# Patient Record
Sex: Female | Born: 1935 | Hispanic: Yes | Smoking: Never smoker
Health system: Southern US, Community
[De-identification: ages and names within clinical notes are randomized; demographics above are authoritative.]

## PROBLEM LIST (undated history)

## (undated) DIAGNOSIS — H05039 Periostitis of unspecified orbit: Secondary | ICD-10-CM

## (undated) DIAGNOSIS — I2510 Atherosclerotic heart disease of native coronary artery without angina pectoris: Secondary | ICD-10-CM

## (undated) DIAGNOSIS — E119 Type 2 diabetes mellitus without complications: Secondary | ICD-10-CM

## (undated) DIAGNOSIS — M1990 Unspecified osteoarthritis, unspecified site: Secondary | ICD-10-CM

## (undated) DIAGNOSIS — J45909 Unspecified asthma, uncomplicated: Secondary | ICD-10-CM

## (undated) DIAGNOSIS — K2970 Gastritis, unspecified, without bleeding: Secondary | ICD-10-CM

## (undated) DIAGNOSIS — I10 Essential (primary) hypertension: Secondary | ICD-10-CM

## (undated) HISTORY — PX: CHOLECYSTECTOMY: SHX55

## (undated) HISTORY — PX: PERCUTANEOUS CORONARY STENT INTERVENTION (PCI-S): SHX6016

## (undated) HISTORY — PX: CARDIAC SURGERY: SHX584

---

## 2007-02-09 ENCOUNTER — Ambulatory Visit: Payer: Self-pay | Admitting: Family

## 2008-04-16 ENCOUNTER — Ambulatory Visit: Payer: Self-pay

## 2008-05-29 ENCOUNTER — Ambulatory Visit: Payer: Self-pay

## 2013-07-10 ENCOUNTER — Ambulatory Visit: Payer: Self-pay | Admitting: Nurse Practitioner

## 2015-08-04 ENCOUNTER — Emergency Department
Admission: EM | Admit: 2015-08-04 | Discharge: 2015-08-05 | Disposition: A | Discharge status: Home / Self Care | Payer: Medicare Other | Attending: Emergency Medicine | Admitting: Emergency Medicine

## 2015-08-04 ENCOUNTER — Encounter: Payer: Self-pay | Admitting: Emergency Medicine

## 2015-08-04 DIAGNOSIS — E119 Type 2 diabetes mellitus without complications: Secondary | ICD-10-CM | POA: Diagnosis not present

## 2015-08-04 DIAGNOSIS — I10 Essential (primary) hypertension: Secondary | ICD-10-CM

## 2015-08-04 HISTORY — DX: Essential (primary) hypertension: I10

## 2015-08-04 HISTORY — DX: Gastritis, unspecified, without bleeding: K29.70

## 2015-08-04 HISTORY — DX: Periostitis of unspecified orbit: H05.039

## 2015-08-04 HISTORY — DX: Type 2 diabetes mellitus without complications: E11.9

## 2015-08-04 HISTORY — DX: Unspecified asthma, uncomplicated: J45.909

## 2015-08-04 HISTORY — DX: Unspecified osteoarthritis, unspecified site: M19.90

## 2015-08-04 HISTORY — DX: Atherosclerotic heart disease of native coronary artery without angina pectoris: I25.10

## 2015-08-04 NOTE — ED Notes (Signed)
Today at 0900 patient took blood pressure and the reading was 149/ something.  30 minutes later SBP 170.  Patient also c/o nausea today that worsened throughout the day.

## 2015-08-04 NOTE — ED Notes (Signed)
C/o pain to back of neck and to side of head.  Onset of symptom last night.  Ibuprofen for pain, last taken 1900 this evening.  Sinus congestion noted.  Also c/o productive cough for yellow phlem x 4-5 days.

## 2015-08-05 LAB — CBC
HCT: 34.3 % — ABNORMAL LOW (ref 35.0–47.0)
Hemoglobin: 11.2 g/dL — ABNORMAL LOW (ref 12.0–16.0)
MCH: 26.5 pg (ref 26.0–34.0)
MCHC: 32.5 g/dL (ref 32.0–36.0)
MCV: 81.3 fL (ref 80.0–100.0)
PLATELETS: 275 10*3/uL (ref 150–440)
RBC: 4.22 MIL/uL (ref 3.80–5.20)
RDW: 14 % (ref 11.5–14.5)
WBC: 8.2 10*3/uL (ref 3.6–11.0)

## 2015-08-05 LAB — COMPREHENSIVE METABOLIC PANEL
ALT: 18 U/L (ref 14–54)
ANION GAP: 5 (ref 5–15)
AST: 20 U/L (ref 15–41)
Albumin: 3.6 g/dL (ref 3.5–5.0)
Alkaline Phosphatase: 114 U/L (ref 38–126)
BUN: 20 mg/dL (ref 6–20)
CHLORIDE: 104 mmol/L (ref 101–111)
CO2: 30 mmol/L (ref 22–32)
Calcium: 9.2 mg/dL (ref 8.9–10.3)
Creatinine, Ser: 0.96 mg/dL (ref 0.44–1.00)
GFR calc non Af Amer: 55 mL/min — ABNORMAL LOW (ref >60)
Glucose, Bld: 200 mg/dL — ABNORMAL HIGH (ref 65–99)
Potassium: 4 mmol/L (ref 3.5–5.1)
SODIUM: 139 mmol/L (ref 135–145)
Total Bilirubin: 0.4 mg/dL (ref 0.3–1.2)
Total Protein: 7.1 g/dL (ref 6.5–8.1)

## 2015-08-05 LAB — TROPONIN I: Troponin I: <0.03 ng/mL (ref <0.031)

## 2015-08-05 NOTE — Discharge Instructions (Signed)
Hipertensin (Hypertension) La hipertensin, conocida comnmente como presin arterial alta, se produce cuando la sangre bombea en las arterias con mucha fuerza. Las arterias son los vasos sanguneos que transportan la sangre desde el corazn hacia todas las partes del cuerpo. Una lectura de la presin arterial consiste en un nmero ms alto sobre un nmero ms bajo, por ejemplo, 110/72. El nmero ms alto (presin sistlica) corresponde a la presin interna de las arterias cuando el corazn bombea sangre. El nmero ms bajo (presin diastlica) corresponde a la presin interna de las arterias cuando el corazn se relaja. En condiciones ideales, la presin arterial debe ser inferior a 120/80. La hipertensin fuerza al corazn a trabajar ms para bombear la sangre. Las arterias pueden estrecharse o ponerse rgidas. La hipertensin no tratada o no controlada puede causar infarto de miocardio, ictus, enfermedad renal y otros problemas. FACTORES DE RIESGO Algunos factores de riesgo de hipertensin son controlables, pero otros no lo son.  Entre los factores de riesgo que usted no puede controlar, se incluyen los siguientes:   La raza. El riesgo es mayor para las personas afroamericanas.  La edad. Los riesgos aumentan con la edad.  El sexo. Antes de los 45aos, los hombres corren ms riesgo que las mujeres. Despus de los 65aos, las mujeres corren ms riesgo que los hombres. Entre los factores de riesgo que usted puede controlar, se incluyen los siguientes:  No hacer la cantidad suficiente de actividad fsica o ejercicio.  Tener sobrepeso.  Consumir mucha grasa, azcar, caloras o sal en la dieta.  Beber alcohol en exceso. SIGNOS Y SNTOMAS Por lo general, la hipertensin no causa signos o sntomas. La hipertensin arterial demasiado alta (crisis hipertensiva) puede causar dolor de cabeza, ansiedad, falta de aire y hemorragia nasal. DIAGNSTICO Para detectar si usted tiene hipertensin, el  mdico le medir la presin arterial mientras est sentado, con el brazo levantado a la altura del corazn. Debe medirla al menos dos veces en el mismo brazo. Determinadas condiciones pueden causar una diferencia de presin arterial entre el brazo izquierdo y el derecho. El hecho de tener una sola lectura de la presin arterial ms alta que lo normal no significa que necesita un tratamiento. Si no est claro si tiene hipertensin arterial, es posible que se le pida que regrese otro da para volver a controlarle la presin arterial. O bien se le puede pedir que se controle la presin arterial en su casa durante 1 o ms meses. TRATAMIENTO El tratamiento de la hipertensin arterial incluye hacer cambios en el estilo de vida y, posiblemente, tomar medicamentos. Un estilo de vida saludable puede ayudar a bajar la presin arterial alta. Quiz deba cambiar algunos hbitos. Los cambios en el estilo de vida pueden incluir lo siguiente:  Seguir la dieta DASH. Esta dieta tiene un alto contenido de frutas, verduras y cereales integrales. Incluye poca cantidad de sal, carnes rojas y azcares agregados.  Mantenga el consumo de sodio por debajo de 2300 mg por da.  Realizar al menos entre 30 y 45 minutos de ejercicio aerbico, 4 veces por semana como mnimo.  Perder peso, si es necesario.  No fumar.  Limitar el consumo de bebidas alcohlicas.  Aprender formas de reducir el estrs. El mdico puede recetarle medicamentos si los cambios en el estilo de vida no son suficientes para lograr controlar la presin arterial y si una de las siguientes afirmaciones es verdadera:  Tiene entre 18 y 59 aos y su presin arterial sistlica est por encima de 140.  Tiene   60 aos o ms y su presin arterial sistlica est por encima de 150.  Su presin arterial diastlica est por encima de 90.  Tiene diabetes y su presin arterial sistlica est por encima de 140 o su presin arterial diastlica est por encima de  90.  Tiene una enfermedad renal y su presin arterial est por encima de 140/90.  Tiene una enfermedad cardaca y su presin arterial est por encima de 140/90. La presin arterial deseada puede variar en funcin de las enfermedades, la edad y otros factores personales. INSTRUCCIONES PARA EL CUIDADO EN EL HOGAR  Haga que le midan de nuevo la presin arterial segn las indicaciones del mdico.  Tome los medicamentos solamente como se lo haya indicado el mdico. Siga cuidadosamente las indicaciones. Los medicamentos para la presin arterial deben tomarse segn las indicaciones. Los medicamentos pierden eficacia al omitir las dosis. El hecho de omitir las dosis tambin aumenta el riesgo de otros problemas.  No fume.  Contrlese la presin arterial en su casa segn las indicaciones del mdico. SOLICITE ATENCIN MDICA SI:   Piensa que tiene una reaccin alrgica a los medicamentos.  Tiene mareos o dolores de cabeza con recurrencia.  Tiene hinchazn en los tobillos.  Tiene problemas de visin. SOLICITE ATENCIN MDICA DE INMEDIATO SI:  Siente un dolor de cabeza intenso o confusin.  Siente debilidad inusual, adormecimiento o que se desmayar.  Siente dolor intenso en el pecho o en el abdomen.  Vomita repetidas veces.  Tiene dificultad para respirar. ASEGRESE DE QUE:   Comprende estas instrucciones.  Controlar su afeccin.  Recibir ayuda de inmediato si no mejora o si empeora.   Esta informacin no tiene como fin reemplazar el consejo del mdico. Asegrese de hacerle al mdico cualquier pregunta que tenga.   Document Released: 09/20/2005 Document Revised: 02/04/2015 Elsevier Interactive Patient Education 2016 [employer]  

## 2015-08-05 NOTE — ED Provider Notes (Signed)
[facility] Emergency Department Provider Note  ____________________________________________  Time seen: 12:15 AM  I have reviewed the triage vital signs and the nursing notes.   HISTORY  Chief Complaint No chief complaint on file.    HPI Dana Holloway is a 81 y.o. female present with hypertension noted at 9 PM tonight per patient's family systolic blood pressure was 170 unknown diastolic. Patient has a known history of hypertension for which she takes metoprolol 12.5 mg daily. Patient denies any chest pain no shortness of breath no headache at present.     Past Medical History  Diagnosis Date  . Hypertension   . Coronary artery disease   . Diabetes mellitus without complication (HCC)   . Arthritis   . Orbital osteo-periostitis   . Asthma   . Gastritis     There are no active problems to display for this patient.   Past Surgical History  Procedure Laterality Date  . Cardiac surgery      cardiac cath with stent 2014  . Cholecystectomy      No current outpatient prescriptions on file.  Allergies Bactrim and Sulfa antibiotics  No family history on file.  Social History Social History  Substance Use Topics  . Smoking status: Never Smoker   . Smokeless tobacco: Never Used  . Alcohol Use: No    Review of Systems  Constitutional: Negative for fever. Eyes: Negative for visual changes. ENT: Negative for sore throat. Cardiovascular: Negative for chest pain. Respiratory: Negative for shortness of breath. Gastrointestinal: Negative for abdominal pain, vomiting and diarrhea. Genitourinary: Negative for dysuria. Musculoskeletal: Negative for back pain. Skin: Negative for rash. Neurological: Negative for headaches, focal weakness or numbness.   10-point ROS otherwise negative.  ____________________________________________   PHYSICAL EXAM:  VITAL SIGNS: ED Triage Vitals  Enc Vitals Group     BP 08/04/15 2224 150/66  mmHg     Pulse Rate 08/04/15 2224 88     Resp 08/04/15 2224 16     Temp 08/04/15 2224 98.7 F (37.1 C)     Temp Source 08/04/15 2224 Oral     SpO2 08/04/15 2224 94 %     Weight 08/04/15 2224 170 lb (77.111 kg)     Height 08/04/15 2224 5' (1.524 m)     Head Cir --      Peak Flow --      Pain Score 08/04/15 2228 7     Pain Loc --      Pain Edu? --      Excl. in GC? --      Constitutional: Alert and oriented. Well appearing and in no distress. Eyes: Conjunctivae are normal. PERRL. Normal extraocular movements. ENT   Head: Normocephalic and atraumatic.   Nose: No congestion/rhinnorhea.   Mouth/Throat: Mucous membranes are moist.   Neck: No stridor. Hematological/Lymphatic/Immunilogical: No cervical lymphadenopathy. Cardiovascular: Normal rate, regular rhythm. Normal and symmetric distal pulses are present in all extremities. No murmurs, rubs, or gallops. Respiratory: Normal respiratory effort without tachypnea nor retractions. Breath sounds are clear and equal bilaterally. No wheezes/rales/rhonchi. Gastrointestinal: Soft and nontender. No distention. There is no CVA tenderness. Genitourinary: deferred Musculoskeletal: Nontender with normal range of motion in all extremities. No joint effusions.  No lower extremity tenderness nor edema. Neurologic:  Normal speech and language. No gross focal neurologic deficits are appreciated. Speech is normal.  Skin:  Skin is warm, dry and intact. No rash noted. Psychiatric: Mood and affect are normal. Speech and behavior are normal.  Patient exhibits appropriate insight and judgment.  ____________________________________________    LABS (pertinent positives/negatives)  Labs Reviewed  CBC - Abnormal; Notable for the following:    Hemoglobin 11.2 (*)    HCT 34.3 (*)    All other components within normal limits  COMPREHENSIVE METABOLIC PANEL - Abnormal; Notable for the following:    Glucose, Bld 200 (*)    GFR calc non Af Amer 55  (*)    All other components within normal limits  TROPONIN I     ____________________________________________   EKG  ED ECG REPORT I, BROWN, Buxton N, the attending physician, personally viewed and interpreted this ECG.   Date: 08/05/2015  EKG Time: 10:45 PM  Rate: 81  Rhythm: Normal sinus rhythm  Axis: None  Intervals: Normal  ST&T Change: None      INITIAL IMPRESSION / ASSESSMENT AND PLAN / ED COURSE  Pertinent labs & imaging results that were available during my care of the patient were reviewed by me and considered in my medical decision making (see chart for details).  Patient's repeat blood pressure emergency department 142/82 heart rate of 82 as such patient advised to take 1 metoprolol 25 mg tablet. Patient and her daughter advised to follow-up with primary care provider for appropriate blood pressure management.  ____________________________________________   FINAL CLINICAL IMPRESSION(S) / ED DIAGNOSES  Final diagnoses:  None      Darci Currentandolph N Brown, MD 08/05/15 [phone]

## 2015-08-23 ENCOUNTER — Emergency Department: Payer: Medicare Other

## 2015-08-23 ENCOUNTER — Inpatient Hospital Stay
Admission: EM | Admit: 2015-08-23 | Discharge: 2015-08-25 | DRG: 189 | Disposition: A | Discharge status: Home / Self Care | Payer: Medicare Other | Attending: Specialist | Admitting: Specialist

## 2015-08-23 ENCOUNTER — Encounter: Payer: Self-pay | Admitting: Emergency Medicine

## 2015-08-23 DIAGNOSIS — E119 Type 2 diabetes mellitus without complications: Secondary | ICD-10-CM

## 2015-08-23 DIAGNOSIS — E1140 Type 2 diabetes mellitus with diabetic neuropathy, unspecified: Secondary | ICD-10-CM | POA: Diagnosis present

## 2015-08-23 DIAGNOSIS — Z7902 Long term (current) use of antithrombotics/antiplatelets: Secondary | ICD-10-CM

## 2015-08-23 DIAGNOSIS — Z833 Family history of diabetes mellitus: Secondary | ICD-10-CM

## 2015-08-23 DIAGNOSIS — Z955 Presence of coronary angioplasty implant and graft: Secondary | ICD-10-CM | POA: Diagnosis not present

## 2015-08-23 DIAGNOSIS — Z794 Long term (current) use of insulin: Secondary | ICD-10-CM

## 2015-08-23 DIAGNOSIS — J45909 Unspecified asthma, uncomplicated: Secondary | ICD-10-CM | POA: Diagnosis present

## 2015-08-23 DIAGNOSIS — F329 Major depressive disorder, single episode, unspecified: Secondary | ICD-10-CM | POA: Diagnosis present

## 2015-08-23 DIAGNOSIS — I119 Hypertensive heart disease without heart failure: Secondary | ICD-10-CM | POA: Diagnosis present

## 2015-08-23 DIAGNOSIS — Z7982 Long term (current) use of aspirin: Secondary | ICD-10-CM

## 2015-08-23 DIAGNOSIS — J849 Interstitial pulmonary disease, unspecified: Secondary | ICD-10-CM | POA: Diagnosis present

## 2015-08-23 DIAGNOSIS — G4730 Sleep apnea, unspecified: Secondary | ICD-10-CM | POA: Diagnosis present

## 2015-08-23 DIAGNOSIS — J9601 Acute respiratory failure with hypoxia: Secondary | ICD-10-CM | POA: Diagnosis present

## 2015-08-23 DIAGNOSIS — R0902 Hypoxemia: Secondary | ICD-10-CM | POA: Diagnosis present

## 2015-08-23 DIAGNOSIS — E871 Hypo-osmolality and hyponatremia: Secondary | ICD-10-CM | POA: Diagnosis present

## 2015-08-23 DIAGNOSIS — R1084 Generalized abdominal pain: Secondary | ICD-10-CM | POA: Diagnosis present

## 2015-08-23 DIAGNOSIS — I2510 Atherosclerotic heart disease of native coronary artery without angina pectoris: Secondary | ICD-10-CM | POA: Diagnosis present

## 2015-08-23 DIAGNOSIS — Z882 Allergy status to sulfonamides status: Secondary | ICD-10-CM

## 2015-08-23 DIAGNOSIS — M1990 Unspecified osteoarthritis, unspecified site: Secondary | ICD-10-CM | POA: Diagnosis present

## 2015-08-23 DIAGNOSIS — J449 Chronic obstructive pulmonary disease, unspecified: Secondary | ICD-10-CM | POA: Diagnosis present

## 2015-08-23 DIAGNOSIS — K219 Gastro-esophageal reflux disease without esophagitis: Secondary | ICD-10-CM | POA: Diagnosis present

## 2015-08-23 DIAGNOSIS — E785 Hyperlipidemia, unspecified: Secondary | ICD-10-CM | POA: Diagnosis present

## 2015-08-23 DIAGNOSIS — Z9049 Acquired absence of other specified parts of digestive tract: Secondary | ICD-10-CM

## 2015-08-23 DIAGNOSIS — B349 Viral infection, unspecified: Secondary | ICD-10-CM | POA: Diagnosis present

## 2015-08-23 DIAGNOSIS — R110 Nausea: Secondary | ICD-10-CM

## 2015-08-23 LAB — COMPREHENSIVE METABOLIC PANEL
ALT: 20 U/L (ref 14–54)
AST: 26 U/L (ref 15–41)
Albumin: 4.1 g/dL (ref 3.5–5.0)
Alkaline Phosphatase: 100 U/L (ref 38–126)
Anion gap: 7 (ref 5–15)
BUN: 18 mg/dL (ref 6–20)
CHLORIDE: 96 mmol/L — AB (ref 101–111)
CO2: 29 mmol/L (ref 22–32)
Calcium: 8.8 mg/dL — ABNORMAL LOW (ref 8.9–10.3)
Creatinine, Ser: 0.62 mg/dL (ref 0.44–1.00)
Glucose, Bld: 207 mg/dL — ABNORMAL HIGH (ref 65–99)
POTASSIUM: 3.5 mmol/L (ref 3.5–5.1)
Sodium: 132 mmol/L — ABNORMAL LOW (ref 135–145)
Total Bilirubin: 0.7 mg/dL (ref 0.3–1.2)
Total Protein: 7.5 g/dL (ref 6.5–8.1)

## 2015-08-23 LAB — CBC
HEMATOCRIT: 37.4 % (ref 35.0–47.0)
Hemoglobin: 12.3 g/dL (ref 12.0–16.0)
MCH: 26.8 pg (ref 26.0–34.0)
MCHC: 33 g/dL (ref 32.0–36.0)
MCV: 81.2 fL (ref 80.0–100.0)
PLATELETS: 265 10*3/uL (ref 150–440)
RBC: 4.6 MIL/uL (ref 3.80–5.20)
RDW: 13.5 % (ref 11.5–14.5)
WBC: 12.1 10*3/uL — ABNORMAL HIGH (ref 3.6–11.0)

## 2015-08-23 LAB — TROPONIN I

## 2015-08-23 LAB — GLUCOSE, CAPILLARY: Glucose-Capillary: 203 mg/dL — ABNORMAL HIGH (ref 65–99)

## 2015-08-23 LAB — LIPASE, BLOOD: LIPASE: 22 U/L (ref 11–51)

## 2015-08-23 MED ORDER — IOHEXOL 240 MG/ML SOLN
25.0000 mL | Freq: Once | INTRAMUSCULAR | Status: AC | PRN
  Administered 2015-08-23: 25 mL via ORAL

## 2015-08-23 MED ORDER — ONDANSETRON HCL 4 MG/2ML IJ SOLN
INTRAMUSCULAR | Status: AC
  Filled 2015-08-23: qty 2

## 2015-08-23 MED ORDER — IOHEXOL 300 MG/ML  SOLN
50.0000 mL | Freq: Once | INTRAMUSCULAR | Status: AC | PRN
  Administered 2015-08-23: 100 mL via INTRAVENOUS

## 2015-08-23 MED ORDER — MORPHINE SULFATE (PF) 4 MG/ML IV SOLN: 4.0000 mg | Freq: Once | INTRAVENOUS | Status: DC

## 2015-08-23 MED ORDER — ONDANSETRON HCL 4 MG/2ML IJ SOLN
4.0000 mg | Freq: Once | INTRAMUSCULAR | Status: AC
  Administered 2015-08-23: 4 mg via INTRAVENOUS

## 2015-08-23 MED ORDER — SODIUM CHLORIDE 0.9 % IV BOLUS (SEPSIS)
1000.0000 mL | Freq: Once | INTRAVENOUS | Status: AC
  Administered 2015-08-23: 1000 mL via INTRAVENOUS

## 2015-08-23 NOTE — ED Notes (Signed)
CT tech notified pt finished drinking contrast.  

## 2015-08-23 NOTE — ED Notes (Signed)
Per pt she had on episode of emesis about a week ago, reports today she ate some lentils int he afternoon and 2 hours after she ate she developed some mid abdominal pain tender to touch, actively vomiting. Pt reports some shortness of breath has history of lung disease unsure of diagnosis. Pt talks in complete sentences no respiratory distress noted.

## 2015-08-23 NOTE — ED Notes (Signed)
Pt transported to CT via stretcher.  

## 2015-08-23 NOTE — ED Notes (Signed)
Pt tolerating drinking contrast well. Pt reported feeling dizzy, states the last time she felt dizzy sugar was low. CBG result was 203. Dr. Lenard LancePaduchowski made aware.

## 2015-08-23 NOTE — ED Provider Notes (Addendum)
[facility] Emergency Department Provider Note  Time seen: 10:35 PM  I have reviewed the triage vital signs and the nursing notes.   HISTORY  Chief Complaint Cough; Abdominal Pain; and Emesis  Hospital interpreter used during this evaluation  HPI Dana Holloway is a 81 y.o. female with a past medical history of hypertension, diabetes, arthritis, asthma, gastritis, who presents the emergency department with upper abdominal pain and nausea and vomiting. According to the patient she ate lunch around 1 PM, shortly after she began with upper abdominal pain and nausea and vomiting. Patient states she has been vomiting ever since. A small amount of diarrhea today as well. Denies any shortness of breath however the patient has an 88% room air oxygen saturation in the emergency department. Denies fever. States mild cough recently but this is largely unchanged from baseline. States abdominal pain is located in the upper abdomen, moderate in severity associated with nausea and vomiting.     Past Medical History  Diagnosis Date  . Hypertension   . Coronary artery disease   . Diabetes mellitus without complication (HCC)   . Arthritis   . Orbital osteo-periostitis   . Asthma   . Gastritis     There are no active problems to display for this patient.   Past Surgical History  Procedure Laterality Date  . Cardiac surgery      cardiac cath with stent 2014  . Cholecystectomy      No current outpatient prescriptions on file.  Allergies Bactrim and Sulfa antibiotics  History reviewed. No pertinent family history.  Social History Social History  Substance Use Topics  . Smoking status: Never Smoker   . Smokeless tobacco: Never Used  . Alcohol Use: No    Review of Systems Constitutional: Negative for fever Cardiovascular: Negative for chest pain. Respiratory: Negative for shortness of breath. Gastrointestinal: Upper abdominal pain, positive for  nausea and vomiting. Mild diarrhea. Genitourinary: Negative for dysuria. Musculoskeletal: Negative for back pain. Neurological: Negative for headache 10-point ROS otherwise negative.  ____________________________________________   PHYSICAL EXAM:  VITAL SIGNS: ED Triage Vitals  Enc Vitals Group     BP 08/23/15 2156 145/62 mmHg     Pulse Rate 08/23/15 2156 79     Resp 08/23/15 2156 18     Temp 08/23/15 2156 97.6 F (36.4 C)     Temp Source 08/23/15 2156 Oral     SpO2 08/23/15 2156 92 %     Weight 08/23/15 2156 173 lb (78.472 kg)     Height 08/23/15 2156  (1.549 m)     Head Cir --      Peak Flow --      Pain Score 08/23/15 2210 3     Pain Loc --      Pain Edu? --      Excl. in GC? --    Constitutional: Alert and oriented. Well appearing and in no distress. Eyes: Normal exam ENT   Head: Normocephalic and atraumatic   Mouth/Throat: Mucous membranes are moist. Cardiovascular: Normal rate, regular rhythm. No murmur Respiratory: Normal respiratory effort without tachypnea nor retractions. Breath sounds are clear and equal bilaterally. No wheezes/rales/rhonchi. Gastrointestinal: Soft, mild to moderate epigastric tenderness to palpation. No rebound or guarding. No distention. Musculoskeletal: Nontender with normal range of motion in all extremities. Neurologic:  Normal speech and language. No gross focal neurologic deficits Skin:  Skin is warm, dry and intact.  Psychiatric: Mood and affect are normal. Speech and behavior  are normal.  ____________________________________________    RADIOLOGY  Chest x-ray shows no acute abnormality. Likely chronic scarring.  ____________________________________________    INITIAL IMPRESSION / ASSESSMENT AND PLAN / ED COURSE  Pertinent labs & imaging results that were available during my care of the patient were reviewed by me and considered in my medical decision making (see chart for details).  Patient presents for abdominal  pain, nausea and vomiting. Patient has a room air oxygen saturation of 90 % currently. Denies the sensation of dyspnea. Mild cough but states this is largely her baseline. We will check labs, chest x-ray, CT abdomen and pelvis, and closely monitor in the emergency department.  Patient denies any dyspnea. She states she was told by her doctor that her lungs just don't work quite right, in reviewing the patient's past oxygen saturations in the emergency department it appears that she typically stays 94-95% on room air.workup so far is largely within normal limits besides a mild leukocytosis of 12. Currently awaiting CT scan results. Patient care signed out to Dr. Zenda AlpersWebster, CT pending.  EKG reviewed and interpreted by myself shows normal sinus rhythm at 77 bpm, narrow QRS, left axis deviation, normal intervals, nonspecific ST changes. No ST elevations.  ____________________________________________   FINAL CLINICAL IMPRESSION(S) / ED DIAGNOSES  Abdominal pain Nausea, vomiting, diarrhea   Minna AntisKevin Shigeko Manard, MD 08/23/15 2345  Minna AntisKevin Raydin Bielinski, MD 08/24/15 0005

## 2015-08-23 NOTE — ED Notes (Signed)
Patient transported to CT via stretcher.

## 2015-08-23 NOTE — ED Notes (Signed)
Pt returned from CT via stretcher.

## 2015-08-23 NOTE — ED Notes (Signed)
Pt arrived to the ED accompanied by her daughter for complaints of abdominal pain, nausea and cough. Pt states that she is unable to hold any food down and that it feels like she has a ball in her stomach. Pt is actively coughing during triage and her O2 sats are 91%. Pt is AOx4 in moderate distress.

## 2015-08-24 ENCOUNTER — Encounter: Payer: Self-pay | Admitting: Internal Medicine

## 2015-08-24 ENCOUNTER — Inpatient Hospital Stay: Payer: Medicare Other

## 2015-08-24 DIAGNOSIS — R1084 Generalized abdominal pain: Secondary | ICD-10-CM | POA: Diagnosis present

## 2015-08-24 DIAGNOSIS — J849 Interstitial pulmonary disease, unspecified: Secondary | ICD-10-CM | POA: Diagnosis present

## 2015-08-24 DIAGNOSIS — B349 Viral infection, unspecified: Secondary | ICD-10-CM | POA: Diagnosis present

## 2015-08-24 DIAGNOSIS — G4730 Sleep apnea, unspecified: Secondary | ICD-10-CM | POA: Diagnosis present

## 2015-08-24 DIAGNOSIS — F329 Major depressive disorder, single episode, unspecified: Secondary | ICD-10-CM | POA: Diagnosis present

## 2015-08-24 DIAGNOSIS — E785 Hyperlipidemia, unspecified: Secondary | ICD-10-CM | POA: Diagnosis present

## 2015-08-24 DIAGNOSIS — I119 Hypertensive heart disease without heart failure: Secondary | ICD-10-CM | POA: Diagnosis present

## 2015-08-24 DIAGNOSIS — E871 Hypo-osmolality and hyponatremia: Secondary | ICD-10-CM | POA: Diagnosis present

## 2015-08-24 DIAGNOSIS — M1990 Unspecified osteoarthritis, unspecified site: Secondary | ICD-10-CM | POA: Diagnosis present

## 2015-08-24 DIAGNOSIS — Z833 Family history of diabetes mellitus: Secondary | ICD-10-CM | POA: Diagnosis not present

## 2015-08-24 DIAGNOSIS — Z9049 Acquired absence of other specified parts of digestive tract: Secondary | ICD-10-CM | POA: Diagnosis not present

## 2015-08-24 DIAGNOSIS — R110 Nausea: Secondary | ICD-10-CM | POA: Diagnosis present

## 2015-08-24 DIAGNOSIS — Z7902 Long term (current) use of antithrombotics/antiplatelets: Secondary | ICD-10-CM | POA: Diagnosis not present

## 2015-08-24 DIAGNOSIS — K219 Gastro-esophageal reflux disease without esophagitis: Secondary | ICD-10-CM | POA: Diagnosis present

## 2015-08-24 DIAGNOSIS — E1140 Type 2 diabetes mellitus with diabetic neuropathy, unspecified: Secondary | ICD-10-CM | POA: Diagnosis present

## 2015-08-24 DIAGNOSIS — J449 Chronic obstructive pulmonary disease, unspecified: Secondary | ICD-10-CM | POA: Diagnosis present

## 2015-08-24 DIAGNOSIS — Z794 Long term (current) use of insulin: Secondary | ICD-10-CM | POA: Diagnosis not present

## 2015-08-24 DIAGNOSIS — J45909 Unspecified asthma, uncomplicated: Secondary | ICD-10-CM | POA: Diagnosis present

## 2015-08-24 DIAGNOSIS — J9601 Acute respiratory failure with hypoxia: Secondary | ICD-10-CM | POA: Diagnosis present

## 2015-08-24 DIAGNOSIS — Z955 Presence of coronary angioplasty implant and graft: Secondary | ICD-10-CM | POA: Diagnosis not present

## 2015-08-24 DIAGNOSIS — R0902 Hypoxemia: Secondary | ICD-10-CM | POA: Diagnosis present

## 2015-08-24 DIAGNOSIS — Z882 Allergy status to sulfonamides status: Secondary | ICD-10-CM | POA: Diagnosis not present

## 2015-08-24 DIAGNOSIS — I2510 Atherosclerotic heart disease of native coronary artery without angina pectoris: Secondary | ICD-10-CM | POA: Diagnosis present

## 2015-08-24 DIAGNOSIS — Z7982 Long term (current) use of aspirin: Secondary | ICD-10-CM | POA: Diagnosis not present

## 2015-08-24 LAB — GLUCOSE, CAPILLARY
Glucose-Capillary: 166 mg/dL — ABNORMAL HIGH (ref 65–99)
Glucose-Capillary: 166 mg/dL — ABNORMAL HIGH (ref 65–99)
Glucose-Capillary: 174 mg/dL — ABNORMAL HIGH (ref 65–99)
Glucose-Capillary: 177 mg/dL — ABNORMAL HIGH (ref 65–99)
Glucose-Capillary: 237 mg/dL — ABNORMAL HIGH (ref 65–99)

## 2015-08-24 LAB — URINALYSIS COMPLETE WITH MICROSCOPIC (ARMC ONLY)
BILIRUBIN URINE: NEGATIVE
Bacteria, UA: NONE SEEN
Glucose, UA: 150 mg/dL — AB
HGB URINE DIPSTICK: NEGATIVE
KETONES UR: NEGATIVE mg/dL
LEUKOCYTES UA: NEGATIVE
Nitrite: NEGATIVE
PH: 8 (ref 5.0–8.0)
Protein, ur: NEGATIVE mg/dL
SPECIFIC GRAVITY, URINE: 1.028 (ref 1.005–1.030)
Squamous Epithelial / LPF: NONE SEEN

## 2015-08-24 LAB — BLOOD GAS, ARTERIAL
ACID-BASE EXCESS: 5.7 mmol/L — AB (ref 0.0–3.0)
BICARBONATE: 31.6 meq/L — AB (ref 21.0–28.0)
FIO2: 0.28
O2 Saturation: 93.8 %
PATIENT TEMPERATURE: 37
PH ART: 7.4 (ref 7.350–7.450)
pCO2 arterial: 51 mmHg — ABNORMAL HIGH (ref 32.0–48.0)
pO2, Arterial: 70 mmHg — ABNORMAL LOW (ref 83.0–108.0)

## 2015-08-24 LAB — HEMOGLOBIN A1C: Hgb A1c MFr Bld: 6.8 % — ABNORMAL HIGH (ref 4.0–6.0)

## 2015-08-24 MED ORDER — NITROGLYCERIN 0.4 MG SL SUBL
0.4000 mg | SUBLINGUAL_TABLET | SUBLINGUAL | Status: DC | PRN
Start: 2015-08-24 — End: 2015-08-25

## 2015-08-24 MED ORDER — DOCUSATE SODIUM 100 MG PO CAPS
100.0000 mg | ORAL_CAPSULE | Freq: Two times a day (BID) | ORAL | Status: DC
  Administered 2015-08-24 – 2015-08-25 (×3): 100 mg via ORAL
  Filled 2015-08-24 (×3): qty 1

## 2015-08-24 MED ORDER — BECLOMETHASONE DIPROPIONATE 80 MCG/ACT IN AERS: 2.0000 | INHALATION_SPRAY | Freq: Two times a day (BID) | RESPIRATORY_TRACT | Status: DC

## 2015-08-24 MED ORDER — ONDANSETRON HCL 4 MG/2ML IJ SOLN: 4.0000 mg | Freq: Four times a day (QID) | INTRAMUSCULAR | Status: DC | PRN

## 2015-08-24 MED ORDER — METOPROLOL SUCCINATE ER 50 MG PO TB24
50.0000 mg | ORAL_TABLET | Freq: Every day | ORAL | Status: DC
  Administered 2015-08-24 – 2015-08-25 (×2): 50 mg via ORAL
  Filled 2015-08-24 (×2): qty 1

## 2015-08-24 MED ORDER — PROMETHAZINE HCL 25 MG/ML IJ SOLN
12.5000 mg | Freq: Once | INTRAMUSCULAR | Status: AC
  Administered 2015-08-24: 12.5 mg via INTRAVENOUS
  Filled 2015-08-24: qty 1

## 2015-08-24 MED ORDER — ASPIRIN EC 81 MG PO TBEC
81.0000 mg | DELAYED_RELEASE_TABLET | Freq: Every day | ORAL | Status: DC
  Administered 2015-08-24 – 2015-08-25 (×2): 81 mg via ORAL
  Filled 2015-08-24 (×2): qty 1

## 2015-08-24 MED ORDER — ONDANSETRON HCL 4 MG PO TABS: 4.0000 mg | ORAL_TABLET | Freq: Four times a day (QID) | ORAL | Status: DC | PRN

## 2015-08-24 MED ORDER — ATORVASTATIN CALCIUM 10 MG PO TABS
10.0000 mg | ORAL_TABLET | Freq: Every day | ORAL | Status: DC
  Administered 2015-08-24: 22:00:00 10 mg via ORAL
  Filled 2015-08-24: qty 1

## 2015-08-24 MED ORDER — METHYLPREDNISOLONE SODIUM SUCC 125 MG IJ SOLR
125.0000 mg | Freq: Once | INTRAMUSCULAR | Status: AC
  Administered 2015-08-24: 125 mg via INTRAVENOUS
  Filled 2015-08-24: qty 2

## 2015-08-24 MED ORDER — ADULT MULTIVITAMIN W/MINERALS CH
1.0000 | ORAL_TABLET | Freq: Every day | ORAL | Status: DC
  Administered 2015-08-24: 1 via ORAL
  Filled 2015-08-24: qty 1

## 2015-08-24 MED ORDER — INSULIN ASPART 100 UNIT/ML ~~LOC~~ SOLN
0.0000 [IU] | Freq: Three times a day (TID) | SUBCUTANEOUS | Status: DC
  Administered 2015-08-24 (×2): 3 [IU] via SUBCUTANEOUS
  Administered 2015-08-24: 5 [IU] via SUBCUTANEOUS
  Administered 2015-08-25: 2 [IU] via SUBCUTANEOUS
  Filled 2015-08-24: qty 2
  Filled 2015-08-24 (×2): qty 3
  Filled 2015-08-24: qty 5

## 2015-08-24 MED ORDER — QUETIAPINE FUMARATE 25 MG PO TABS
25.0000 mg | ORAL_TABLET | Freq: Every day | ORAL | Status: DC
  Administered 2015-08-24: 25 mg via ORAL
  Filled 2015-08-24: qty 1

## 2015-08-24 MED ORDER — HEPARIN SODIUM (PORCINE) 5000 UNIT/ML IJ SOLN
5000.0000 [IU] | Freq: Three times a day (TID) | INTRAMUSCULAR | Status: DC
  Administered 2015-08-24 – 2015-08-25 (×4): 5000 [IU] via SUBCUTANEOUS
  Filled 2015-08-24 (×4): qty 1

## 2015-08-24 MED ORDER — MICONAZOLE NITRATE 2 % EX CREA
1.0000 "application " | TOPICAL_CREAM | Freq: Two times a day (BID) | CUTANEOUS | Status: DC
  Administered 2015-08-24 (×2): 1 via TOPICAL
  Filled 2015-08-24 (×2): qty 14

## 2015-08-24 MED ORDER — IPRATROPIUM-ALBUTEROL 0.5-2.5 (3) MG/3ML IN SOLN
3.0000 mL | Freq: Once | RESPIRATORY_TRACT | Status: AC
  Administered 2015-08-24: 3 mL via RESPIRATORY_TRACT
  Filled 2015-08-24: qty 3

## 2015-08-24 MED ORDER — LOSARTAN POTASSIUM 25 MG PO TABS
12.5000 mg | ORAL_TABLET | Freq: Every day | ORAL | Status: DC
  Administered 2015-08-24 – 2015-08-25 (×2): 12.5 mg via ORAL
  Filled 2015-08-24 (×2): qty 0.5

## 2015-08-24 MED ORDER — ACETAMINOPHEN 325 MG PO TABS
650.0000 mg | ORAL_TABLET | Freq: Four times a day (QID) | ORAL | Status: DC | PRN
  Administered 2015-08-24: 650 mg via ORAL
  Filled 2015-08-24: qty 2

## 2015-08-24 MED ORDER — SODIUM CHLORIDE 0.9 % IV SOLN
INTRAVENOUS | Status: DC
  Administered 2015-08-24: 100 mL via INTRAVENOUS
  Administered 2015-08-24: 21:00:00 via INTRAVENOUS

## 2015-08-24 MED ORDER — INSULIN GLARGINE 100 UNIT/ML ~~LOC~~ SOLN
28.0000 [IU] | Freq: Every day | SUBCUTANEOUS | Status: DC
  Administered 2015-08-24: 28 [IU] via SUBCUTANEOUS
  Filled 2015-08-24 (×3): qty 0.28

## 2015-08-24 MED ORDER — PREGABALIN 50 MG PO CAPS
50.0000 mg | ORAL_CAPSULE | Freq: Two times a day (BID) | ORAL | Status: DC
  Administered 2015-08-24 – 2015-08-25 (×2): 50 mg via ORAL
  Filled 2015-08-24 (×2): qty 1

## 2015-08-24 MED ORDER — MOMETASONE FURO-FORMOTEROL FUM 100-5 MCG/ACT IN AERO
2.0000 | INHALATION_SPRAY | Freq: Two times a day (BID) | RESPIRATORY_TRACT | Status: DC
  Administered 2015-08-24 – 2015-08-25 (×3): 2 via RESPIRATORY_TRACT
  Filled 2015-08-24 (×2): qty 8.8

## 2015-08-24 MED ORDER — CLOPIDOGREL BISULFATE 75 MG PO TABS
75.0000 mg | ORAL_TABLET | Freq: Every day | ORAL | Status: DC
  Administered 2015-08-24 – 2015-08-25 (×2): 75 mg via ORAL
  Filled 2015-08-24 (×2): qty 1

## 2015-08-24 MED ORDER — PANTOPRAZOLE SODIUM 40 MG PO TBEC
40.0000 mg | DELAYED_RELEASE_TABLET | Freq: Every day | ORAL | Status: DC
  Administered 2015-08-24 – 2015-08-25 (×2): 40 mg via ORAL
  Filled 2015-08-24 (×2): qty 1

## 2015-08-24 MED ORDER — VITAMIN D 1000 UNITS PO TABS
1000.0000 [IU] | ORAL_TABLET | Freq: Every day | ORAL | Status: DC
  Administered 2015-08-24 – 2015-08-25 (×2): 1000 [IU] via ORAL
  Filled 2015-08-24 (×2): qty 1

## 2015-08-24 MED ORDER — METHYLPREDNISOLONE SODIUM SUCC 125 MG IJ SOLR: 60.0000 mg | Freq: Once | INTRAMUSCULAR | Status: DC

## 2015-08-24 MED ORDER — ACETAMINOPHEN 650 MG RE SUPP
650.0000 mg | Freq: Four times a day (QID) | RECTAL | Status: DC | PRN
Start: 2015-08-24 — End: 2015-08-25

## 2015-08-24 MED ORDER — CITALOPRAM HYDROBROMIDE 10 MG PO TABS
10.0000 mg | ORAL_TABLET | Freq: Every day | ORAL | Status: DC
  Administered 2015-08-24 – 2015-08-25 (×2): 10 mg via ORAL
  Filled 2015-08-24 (×2): qty 1

## 2015-08-24 MED ORDER — ALBUTEROL SULFATE (2.5 MG/3ML) 0.083% IN NEBU: 3.0000 mL | INHALATION_SOLUTION | Freq: Four times a day (QID) | RESPIRATORY_TRACT | Status: DC | PRN

## 2015-08-24 NOTE — ED Notes (Signed)
Pt taken off oxygen per MD order, pt O2 sat dropped to 86-87% RA. Dr. Zenda AlpersWebster notified, pt placed back on 2 L nasal cannula. O2 sat is now 98%. Pt reports she was told she had a "bad lung" but is unsure what diagnosis was. Pt has inhalers at home which she takes when feeling SHOB. Pt denies being on oxygen at home.

## 2015-08-24 NOTE — Progress Notes (Signed)
[facility] [place] [facility]   PATIENT NAME: Dana Holloway    MR#:  [id number]  DATE OF BIRTH:  Nov 26, 1933  SUBJECTIVE:   Patient here due to abdominal pain, nausea vomiting. But also incidentally noted to be hypoxic. Patient denies any pain, nausea vomiting today. Abdominal CT on admission was negative. Daughter at bedside. Patient seen with the help of Spanish interpreter.  REVIEW OF SYSTEMS:    Review of Systems  Constitutional: Negative for fever and chills.  HENT: Negative for congestion and tinnitus.   Eyes: Negative for blurred vision and double vision.  Respiratory: Positive for shortness of breath. Negative for cough and wheezing.   Cardiovascular: Negative for chest pain, orthopnea and PND.  Gastrointestinal: Negative for nausea, vomiting, abdominal pain and diarrhea.  Genitourinary: Negative for dysuria and hematuria.  Neurological: Negative for dizziness, sensory change and focal weakness.  All other systems reviewed and are negative.   Nutrition: Heart healthy Tolerating Diet: yes Tolerating PT: Ambulatory   DRUG ALLERGIES:   Allergies  Allergen Reactions  . Bactrim [Sulfamethoxazole-Trimethoprim] Hives  . Sulfa Antibiotics Hives    VITALS:  Blood pressure 111/45, pulse 85, temperature 98.2 F (36.8 C), temperature source Oral, resp. rate 18, height  (1.549 m), weight 79.334 kg (174 lb 14.4 oz), SpO2 91 %.  PHYSICAL EXAMINATION:   Physical Exam  GENERAL:  81 y.o.-year-old patient lying in the bed with no acute distress.  EYES: Pupils equal, round, reactive to light and accommodation. No scleral icterus. Extraocular muscles intact.  HEENT: Head atraumatic, normocephalic. Oropharynx and nasopharynx clear.  NECK:  Supple, no jugular venous distention. No thyroid enlargement, no tenderness.  LUNGS: Normal breath sounds bilaterally, no wheezing, rales, rhonchi. No use of accessory muscles of respiration.   CARDIOVASCULAR: S1, S2 normal. No murmurs, rubs, or gallops.  ABDOMEN: Soft, nontender, nondistended. Bowel sounds present. No organomegaly or mass.  EXTREMITIES: No cyanosis, clubbing or edema b/l.    NEUROLOGIC: Cranial nerves II through XII are intact. No focal Motor or sensory deficits b/l.   PSYCHIATRIC: The patient is alert and oriented x 3. Good affect.  SKIN: No obvious rash, lesion, or ulcer.    LABORATORY PANEL:   CBC  Recent Labs Lab 08/23/15 2241  WBC 12.1*  HGB 12.3  HCT 37.4  PLT 265   ------------------------------------------------------------------------------------------------------------------  Chemistries   Recent Labs Lab 08/23/15 2241  NA 132*  K 3.5  CL 96*  CO2 29  GLUCOSE 207*  BUN 18  CREATININE 0.62  CALCIUM 8.8*  AST 26  ALT 20  ALKPHOS 100  BILITOT 0.7   ------------------------------------------------------------------------------------------------------------------  Cardiac Enzymes  Recent Labs Lab 08/23/15 2241  TROPONINI <0.03   ------------------------------------------------------------------------------------------------------------------  RADIOLOGY:  Dg Chest 2 View  08/23/2015  CLINICAL DATA:  Upper abdominal pain, nausea and vomiting. History of hypertension, diabetes. EXAM: CHEST  2 VIEW COMPARISON:  Chest radiograph Feb 09, 2007 FINDINGS: The cardiac silhouette is moderately enlarged, similar to slightly increased. Diffuse interstitial prominence appears chronic, without pleural effusion or focal consolidations. No pneumothorax. Soft tissue planes and included osseous structures are nonsuspicious, old approximate T9 compression fracture. IMPRESSION: Similar to slightly progressed cardiomegaly. Diffuse interstitial prominence, likely chronic though, can also be seen with atypical infection. Electronically Signed   By: Awilda Metro M.D.   On: 08/23/2015 23:22   Ct Abdomen Pelvis W Contrast  08/24/2015  CLINICAL  DATA:  Upper abdominal pain with EXAM: CT ABDOMEN AND PELVIS WITH CONTRAST TECHNIQUE:  Multidetector CT imaging of the abdomen and pelvis was performed using the standard protocol following bolus administration of intravenous contrast. CONTRAST:  100mL OMNIPAQUE IOHEXOL 300 MG/ML  SOLN COMPARISON:  None. FINDINGS: Lower chest: Increased AP diameter of the thorax. Heart at the upper limits normal in size. Linear atelectasis in the right lower lobe. Liver: No focal lesion. Hepatobiliary: Postcholecystectomy.  No biliary dilatation. Pancreas: No ductal dilatation or inflammation. Mild atrophy of the tail. Spleen: Normal. Adrenal glands: No nodule. Kidneys: Symmetric renal enhancement and excretion. No hydronephrosis. No perinephric stranding or focal renal abnormality. Stomach/Bowel: Stomach physiologically distended. There are no dilated or thickened small bowel loops. Moderate volume of stool throughout the colon without colonic wall thickening. The appendix is normal. Vascular/Lymphatic: No retroperitoneal adenopathy. Abdominal aorta is normal in caliber. Reproductive: Uterus appears surgically absent. No adnexal mass. Ovaries not defined. Bladder: Physiologically distended, no wall thickening. Other: Fat containing umbilical hernia. Subcutaneous edema in the anterior abdominal wall in a pattern consistent with injections. No free air, free fluid, or intra-abdominal fluid collection. Musculoskeletal: There are no acute or suspicious osseous abnormalities. Degenerative change throughout spine. IMPRESSION: 1. No acute abnormality in the abdomen/pelvis. 2. Postcholecystectomy without biliary dilatation. Small fat containing umbilical hernia. Electronically Signed   By: Rubye OaksMelanie  Ehinger M.D.   On: 08/24/2015 00:24     ASSESSMENT AND PLAN:   81 year old Spanish female with past medical history of diabetes, hypertension, history of coronary artery disease, osteoarthritis, asthma, gastritis who presents to the  hospital due to abdominal pain nausea vomiting and also noted to be hypoxic.  #1 acute respiratory failure with hypoxia-the exact etiology of this is unclear presently. -Patient has no history of tobacco abuse, chest x-ray showed some diffuse interstitial prominence. 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   -No evidence of CHF clinically. I will get a high-resolution CT scan to evaluate her lungs. Await pulmonary evaluation.  #2 abdominal pain nausea and vomiting-etiology unclear but likely secondary to a viral illness which is now resolved. CT abdomen pelvis on admission was essentially normal. -Patient tolerating oral diet well. Continue supportive care for now.  #3 diabetes type 2 without complication-continue Lantus, sliding scale insulin.  #4 hypertension-continue losartan, metoprolol.  #5 depression-continue Seroquel.  #6 diabetic neuropathy-continue Lyrica  #7 GERD-continue Protonix.  #8 history of coronary artery disease-no chest pain. Continue aspirin, Plavix, beta blocker, statin.   All the records are reviewed and case discussed with Care Management/Social Workerr. Management plans discussed with the patient, family and they are in agreement.  CODE STATUS: Full  DVT Prophylaxis: Heparin subcutaneous  TOTAL TIME TAKING CARE OF THIS PATIENT: 35 minutes.   POSSIBLE D/C IN 1-2 DAYS, DEPENDING ON CLINICAL CONDITION.   Houston SirenSAINANI,Merit Gadsby J M.D on 08/24/2015 at 1:25 PM  Between 7am to 6pm - Pager - [phone]  After 6pm go to [email or web address] - password [email or web address]  [facility]  Office  [phone]  CC: Primary care physician; Pcp Not In System

## 2015-08-24 NOTE — Plan of Care (Signed)
Problem: Education: Goal: Knowledge of [facility] Education information/materials will improve Outcome: Progressing Pt lives with daughter. Pt does not speak [place].     Past Medical History   Diagnosis  Date   .  Hypertension     .  Coronary artery disease     .  Diabetes mellitus without complication (HCC)     .  Arthritis     .  Orbital osteo-periostitis     .  Asthma     .  Gastritis            Pt is well controlled by home medications.        Problem: Bowel/Gastric: Goal: Will not experience complications related to bowel motility Outcome: Progressing Pt was admitted at shift change. Pt alert and oriented. No c/o pain nor distress noted. Daughter at bedside. Continue to monitor.

## 2015-08-24 NOTE — H&P (Signed)
Dana Holloway is an 81 y.o. female.   Chief Complaint: Abdominal pain HPI: The patient presents emergency department complaining of abdominal pain. This has been a chronic complaint albeit intermittent. She admits to constipation at times. She also states that she feels short of breath and has been coughing "forever". She had worked as a cook over open fire for 40 years. The patient admits to some sputum production with cough but denies fevers. She admits to nausea but no vomiting. She also states that her blood pressure the tendency to increase at night which causes her difficulty breathing. She also notes palpitations when this occurs. The timing of her symptoms is difficult to pinpoint as the patient is not the best historian. In the emergency department the patient was found to be hypoxic to the mid 80s. She does not wear oxygen at home. Despite supplemental oxygen her peripheral oxygen saturations increased to just the low 90s which prompted emergency department staff to call for admission.  Past Medical History  Diagnosis Date  . Hypertension   . Coronary artery disease   . Diabetes mellitus without complication (HCC)   . Arthritis   . Orbital osteo-periostitis   . Asthma   . Gastritis     Past Surgical History  Procedure Laterality Date  . Cardiac surgery      cardiac cath with stent 2014  . Cholecystectomy      Family History  Problem Relation Age of Onset  . Diabetes Mellitus II Other    Social History:  reports that she has never smoked. She has never used smokeless tobacco. She reports that she does not drink alcohol or use illicit drugs.  Allergies:  Allergies  Allergen Reactions  . Bactrim [Sulfamethoxazole-Trimethoprim] Hives  . Sulfa Antibiotics Hives    Prior to Admission medications   Medication Sig Start Date End Date Taking? Authorizing Provider  acetaminophen (TYLENOL) 500 MG tablet Take 1,000 mg by mouth every 8 (eight) hours as needed (arthritis  pain).   Yes Historical Provider, MD  albuterol (PROVENTIL HFA;VENTOLIN HFA) 108 (90 BASE) MCG/ACT inhaler Inhale 2 puffs into the lungs every 6 (six) hours as needed for wheezing or shortness of breath.   Yes Historical Provider, MD  aspirin EC 81 MG tablet Take 81 mg by mouth daily.   Yes Historical Provider, MD  atorvastatin (LIPITOR) 10 MG tablet Take 10 mg by mouth at bedtime.   Yes Historical Provider, MD  beclomethasone (QVAR) 80 MCG/ACT inhaler Inhale 2 puffs into the lungs 2 (two) times daily.   Yes Historical Provider, MD  cholecalciferol (VITAMIN D) 1000 UNITS tablet Take 1,000 Units by mouth daily.   Yes Historical Provider, MD  citalopram (CELEXA) 10 MG tablet Take 10 mg by mouth daily.   Yes Historical Provider, MD  clopidogrel (PLAVIX) 75 MG tablet Take 75 mg by mouth daily.   Yes Historical Provider, MD  diclofenac sodium (VOLTAREN) 1 % GEL Apply 1 application topically 4 (four) times daily.   Yes Historical Provider, MD  docusate sodium (COLACE) 100 MG capsule Take 2 mg by mouth 2 (two) times daily as needed for mild constipation.   Yes Historical Provider, MD  insulin glargine (LANTUS) 100 UNIT/ML injection Inject 35-42 Units into the skin at bedtime. 42 units in the morning and 35 units at night   Yes Historical Provider, MD  losartan (COZAAR) 25 MG tablet Take 12.5 mg by mouth daily.   Yes Historical Provider, MD  metFORMIN (GLUCOPHAGE) 500 MG tablet   Take 500 mg by mouth 2 (two) times daily with a meal.   Yes Historical Provider, MD  metoprolol succinate (TOPROL-XL) 25 MG 24 hr tablet Take 50 mg by mouth daily.   Yes Historical Provider, MD  miconazole (MICOTIN) 2 % cream Apply 1 application topically 2 (two) times daily. Apply to feet   Yes Historical Provider, MD  Multiple Vitamin (MULTIVITAMIN WITH MINERALS) TABS tablet Take 1 tablet by mouth at bedtime.    Yes Historical Provider, MD  nitroGLYCERIN (NITROSTAT) 0.4 MG SL tablet Place 0.4 mg under the tongue every 5 (five)  minutes as needed for chest pain.   Yes Historical Provider, MD  omeprazole (PRILOSEC) 40 MG capsule Take 40 mg by mouth daily.   Yes Historical Provider, MD  pregabalin (LYRICA) 50 MG capsule Take 50 mg by mouth 2 (two) times daily.   Yes Historical Provider, MD  QUEtiapine (SEROQUEL) 25 MG tablet Take 25-50 mg by mouth at bedtime.   Yes Historical Provider, MD     Results for orders placed or performed during the hospital encounter of 08/23/15 (from the past 48 hour(s))  CBC     Status: Abnormal   Collection Time: 08/23/15 10:41 PM  Result Value Ref Range   WBC 12.1 (H) 3.6 - 11.0 K/uL   RBC 4.60 3.80 - 5.20 MIL/uL   Hemoglobin 12.3 12.0 - 16.0 g/dL   HCT 37.4 35.0 - 47.0 %   MCV 81.2 80.0 - 100.0 fL   MCH 26.8 26.0 - 34.0 pg   MCHC 33.0 32.0 - 36.0 g/dL   RDW 13.5 11.5 - 14.5 %   Platelets 265 150 - 440 K/uL  Comprehensive metabolic panel     Status: Abnormal   Collection Time: 08/23/15 10:41 PM  Result Value Ref Range   Sodium 132 (L) 135 - 145 mmol/L   Potassium 3.5 3.5 - 5.1 mmol/L   Chloride 96 (L) 101 - 111 mmol/L   CO2 29 22 - 32 mmol/L   Glucose, Bld 207 (H) 65 - 99 mg/dL   BUN 18 6 - 20 mg/dL   Creatinine, Ser 0.62 0.44 - 1.00 mg/dL   Calcium 8.8 (L) 8.9 - 10.3 mg/dL   Total Protein 7.5 6.5 - 8.1 g/dL   Albumin 4.1 3.5 - 5.0 g/dL   AST 26 15 - 41 U/L   ALT 20 14 - 54 U/L   Alkaline Phosphatase 100 38 - 126 U/L   Total Bilirubin 0.7 0.3 - 1.2 mg/dL   GFR calc non Af Amer >60 >60 mL/min   GFR calc Af Amer >60 >60 mL/min    Comment: (NOTE) The eGFR has been calculated using the CKD EPI equation. This calculation has not been validated in all clinical situations. eGFR's persistently <60 mL/min signify possible Chronic Kidney Disease.    Anion gap 7 5 - 15  Lipase, blood     Status: None   Collection Time: 08/23/15 10:41 PM  Result Value Ref Range   Lipase 22 11 - 51 U/L  Troponin I     Status: None   Collection Time: 08/23/15 10:41 PM  Result Value Ref Range    Troponin I <0.03 <0.031 ng/mL    Comment:        NO INDICATION OF MYOCARDIAL INJURY.   Glucose, capillary     Status: Abnormal   Collection Time: 08/23/15 11:07 PM  Result Value Ref Range   Glucose-Capillary 203 (H) 65 - 99 mg/dL  Urinalysis complete, with microscopic (  [facility] only)     Status: Abnormal   Collection Time: 08/24/15 12:06 AM  Result Value Ref Range   Color, Urine YELLOW (A) YELLOW   APPearance CLEAR (A) CLEAR   Glucose, UA 150 (A) NEGATIVE mg/dL   Bilirubin Urine NEGATIVE NEGATIVE   Ketones, ur NEGATIVE NEGATIVE mg/dL   Specific Gravity, Urine 1.028 1.005 - 1.030   Hgb urine dipstick NEGATIVE NEGATIVE   pH 8.0 5.0 - 8.0   Protein, ur NEGATIVE NEGATIVE mg/dL   Nitrite NEGATIVE NEGATIVE   Leukocytes, UA NEGATIVE NEGATIVE   RBC / HPF 0-5 0 - 5 RBC/hpf   WBC, UA 0-5 0 - 5 WBC/hpf   Bacteria, UA NONE SEEN NONE SEEN   Squamous Epithelial / LPF NONE SEEN NONE SEEN   Mucous PRESENT   Blood gas, arterial     Status: Abnormal   Collection Time: 08/24/15  2:10 AM  Result Value Ref Range   FIO2 0.28    Delivery systems NASAL CANNULA    pH, Arterial 7.40 7.350 - 7.450   pCO2 arterial 51 (H) 32.0 - 48.0 mmHg   pO2, Arterial 70 (L) 83.0 - 108.0 mmHg   Bicarbonate 31.6 (H) 21.0 - 28.0 mEq/L   Acid-Base Excess 5.7 (H) 0.0 - 3.0 mmol/L   O2 Saturation 93.8 %   Patient temperature 37.0    Collection site LEFT RADIAL    Sample type ARTERIAL DRAW    Allens test (pass/fail) PASS PASS   Dg Chest 2 View  08/23/2015  CLINICAL DATA:  Upper abdominal pain, nausea and vomiting. History of hypertension, diabetes. EXAM: CHEST  2 VIEW COMPARISON:  Chest radiograph Feb 09, 2007 FINDINGS: The cardiac silhouette is moderately enlarged, similar to slightly increased. Diffuse interstitial prominence appears chronic, without pleural effusion or focal consolidations. No pneumothorax. Soft tissue planes and included osseous structures are nonsuspicious, old approximate T9 compression fracture.  IMPRESSION: Similar to slightly progressed cardiomegaly. Diffuse interstitial prominence, likely chronic though, can also be seen with atypical infection. Electronically Signed   By: Courtnay  Bloomer M.D.   On: 08/23/2015 23:22   Ct Abdomen Pelvis W Contrast  08/24/2015  CLINICAL DATA:  Upper abdominal pain with EXAM: CT ABDOMEN AND PELVIS WITH CONTRAST TECHNIQUE: Multidetector CT imaging of the abdomen and pelvis was performed using the standard protocol following bolus administration of intravenous contrast. CONTRAST:  100mL OMNIPAQUE IOHEXOL 300 MG/ML  SOLN COMPARISON:  None. FINDINGS: Lower chest: Increased AP diameter of the thorax. Heart at the upper limits normal in size. Linear atelectasis in the right lower lobe. Liver: No focal lesion. Hepatobiliary: Postcholecystectomy.  No biliary dilatation. Pancreas: No ductal dilatation or inflammation. Mild atrophy of the tail. Spleen: Normal. Adrenal glands: No nodule. Kidneys: Symmetric renal enhancement and excretion. No hydronephrosis. No perinephric stranding or focal renal abnormality. Stomach/Bowel: Stomach physiologically distended. There are no dilated or thickened small bowel loops. Moderate volume of stool throughout the colon without colonic wall thickening. The appendix is normal. Vascular/Lymphatic: No retroperitoneal adenopathy. Abdominal aorta is normal in caliber. Reproductive: Uterus appears surgically absent. No adnexal mass. Ovaries not defined. Bladder: Physiologically distended, no wall thickening. Other: Fat containing umbilical hernia. Subcutaneous edema in the anterior abdominal wall in a pattern consistent with injections. No free air, free fluid, or intra-abdominal fluid collection. Musculoskeletal: There are no acute or suspicious osseous abnormalities. Degenerative change throughout spine. IMPRESSION: 1. No acute abnormality in the abdomen/pelvis. 2. Postcholecystectomy without biliary dilatation. Small fat containing umbilical  hernia. Electronically Signed   By:   Jeb Levering M.D.   On: 08/24/2015 00:24    Review of Systems  Constitutional: Negative for fever and chills.  HENT: Negative for sore throat and tinnitus.   Eyes: Negative for blurred vision and redness.  Respiratory: Positive for cough and shortness of breath.   Cardiovascular: Positive for palpitations. Negative for chest pain, orthopnea and PND.  Gastrointestinal: Positive for nausea, abdominal pain and constipation. Negative for vomiting and diarrhea.  Genitourinary: Negative for dysuria, urgency and frequency.  Musculoskeletal: Negative for myalgias and joint pain.  Skin: Negative for rash.       No lesions  Neurological: Positive for dizziness. Negative for speech change, focal weakness and weakness.  Endo/Heme/Allergies: Does not bruise/bleed easily.       No temperature intolerance  Psychiatric/Behavioral: Negative for depression and suicidal ideas.    Blood pressure 107/59, pulse 86, temperature 97.6 F (36.4 C), temperature source Oral, resp. rate 20, height 5' 1" (1.549 m), weight 78.472 kg (173 lb), SpO2 99 %. Physical Exam  Nursing note and vitals reviewed. Constitutional: She is oriented to person, place, and time. She appears well-developed and well-nourished. No distress.  HENT:  Head: Normocephalic and atraumatic.  Mouth/Throat: Oropharynx is clear and moist.  Eyes: Conjunctivae and EOM are normal. Pupils are equal, round, and reactive to light.  Neck: Normal range of motion. Neck supple. No JVD present. No tracheal deviation present. No thyromegaly present.  Cardiovascular: Normal rate, regular rhythm and normal heart sounds.  Exam reveals no gallop and no friction rub.   No murmur heard. Respiratory: Effort normal and breath sounds normal.  GI: Soft. Bowel sounds are normal. She exhibits no distension. There is no tenderness.  Genitourinary:  Deferred  Lymphadenopathy:    She has no cervical adenopathy.  Neurological:  She is alert and oriented to person, place, and time. No cranial nerve deficit. She exhibits normal muscle tone.  Skin: Skin is warm and dry. No rash noted. No erythema.  Psychiatric: She has a normal mood and affect. Her behavior is normal. Judgment and thought content normal.     Assessment/Plan This is a 81 year old Hispanic female admitted for hypoxemia and constipation. 1. Hypoxemia: Likely secondary to interstitial lung disease and/or COPD. I have given her a dose of Solu-Medrol in the emergency department. The patient also likely has sleep apnea. Her peripheral oxygen saturation has improved with supplemental oxygen via nasal cannula. She is more comfortable. I have discontinued Qvar and added inhaled corticosteroid with long-acting bronchial agonist. Her palpitations and reports of high blood pressure readings at night may improve with CPAP. We'll place a pulmonology consult for further recommendations regarding possible bronchoscopy, pulmonary function tests and sleep study. 2. CAD: Stable; the patient denies chest pain. Continue aspirin and Plavix 3. Hypertension: Controlled continue metoprolol and losartan 4. Diabetes mellitus type II: Hold Glucophage for now. Continue basal insulin and add sliding scale insulin while hospitalized. 5. Hyponatremia: Likely secondary to chronic lung disease. We'll hopefully improve with normal saline 6. Depression: Continue Celexa and Seroquel 7. Hyperlipidemia: Continue statin therapy 8. Constipation: Likely source of abdominal pain. Stool softener and IV hydration 9. DVT prophylaxis: Heparin 10. GI prophylaxis: PPI as reflux may be worsening lung inflammation The patient is a full code. Time spent on admission orders and patient care proximally 45 minutes  Harrie Foreman 08/24/2015, 5:21 AM

## 2015-08-24 NOTE — ED Notes (Signed)
Pt ambulated to the bedside bathroom.

## 2015-08-24 NOTE — ED Provider Notes (Signed)
-----------------------------------------   4:12 AM on 08/24/2015 -----------------------------------------   Blood pressure 113/57, pulse 80, temperature 97.6 F (36.4 C), temperature source Oral, resp. rate 16, height 5\' 1"  (1.549 m), weight 173 lb (78.472 kg), SpO2 94 %.  Assuming care from Dr. Lenard LancePaduchowski.  In short, Marliss CzarFaviana Ma HillockFranco de Zuniga is a 81 y.o. female with a chief complaint of Cough; Abdominal Pain; and Emesis .  Refer to the original H&P for additional details.  The current plan of care is to follow up the CT scan: .  CT Scan: No acute abnormality in the abdomen and pelvis, post cholecystectomy without biliary dilation. Small fat containing umbilical hernia.  The patient's pain improved but she was found to be hypoxic. The patient was not having any chest pain nor SOB. I checked an abg and she was found to have a po2 of 70 while on 2 L of o2. We gave the patient a duoneb but she continuted to be hypoxic. The patient will be admitted to the hospital for further evaluation.   Rebecka ApleyAllison P Webster, MD 08/24/15 [phone]

## 2015-08-24 NOTE — ED Notes (Signed)
Pt reports feeling nauseous at this time. MD notified, [facility]

## 2015-08-25 DIAGNOSIS — J9601 Acute respiratory failure with hypoxia: Secondary | ICD-10-CM | POA: Diagnosis not present

## 2015-08-25 DIAGNOSIS — R110 Nausea: Secondary | ICD-10-CM | POA: Diagnosis not present

## 2015-08-25 LAB — GLUCOSE, CAPILLARY
Glucose-Capillary: 114 mg/dL — ABNORMAL HIGH (ref 65–99)
Glucose-Capillary: 150 mg/dL — ABNORMAL HIGH (ref 65–99)

## 2015-08-25 NOTE — Discharge Summary (Signed)
[facility] - [place] at [facility]   PATIENT NAME: Dana Holloway    MR#:  [id number]  DATE OF BIRTH:  1933/10/20  DATE OF ADMISSION:  08/23/2015 ADMITTING PHYSICIAN: Arnaldo Natal, MD  DATE OF DISCHARGE: 08/25/2015  1:11 PM  PRIMARY CARE PHYSICIAN: Pcp Not In System    ADMISSION DIAGNOSIS:  Nausea [R11.0] Generalized abdominal pain [R10.84] Hypoxia [R09.02]  DISCHARGE DIAGNOSIS:  Active Problems:   Hypoxia   SECONDARY DIAGNOSIS:   Past Medical History  Diagnosis Date  . Hypertension   . Coronary artery disease   . Diabetes mellitus without complication (HCC)   . Arthritis   . Orbital osteo-periostitis   . Asthma   . Gastritis     HOSPITAL COURSE:  81 year old Spanish female with past medical history of diabetes, hypertension, history of coronary artery disease, osteoarthritis, asthma, gastritis who presents to the hospital due to abdominal pain nausea vomiting and also noted to be hypoxic.  #1 acute respiratory failure with hypoxia-she presented to the hospital due to acute hypoxia but worse with exertion. She was observed overnight and underwent a high-resolution CT chest to workup possible interstitial lung disease given her chest x-ray findings on admission. -High-resolution CT chest did not show any evidence of interstitial lung disease but more postinfectious process versus atypical MAI infection.  She had no acute respiratory symptoms like cough, hemoptysis, night sweats, weight loss. -She was seen by pulmonary by Dr. Meredeth Ide who did not recommend any further acute intervention but rather outpatient follow-up for PFTs and also possible sleep study. -Patient was ambulated and her O2 sats did not go below 91% and therefore she was not discharged on any oxygen. She'll resume her albuterol, Qvar inhalers.  #2 abdominal pain nausea and vomiting-etiology unclear but likely secondary to a viral illness which was resolved prior to  admission. -Patient's CT abdomen pelvis on admission did not show any evidence of acute pathology. -Patient tolerating oral diet well prior to discharge  #3 diabetes type 2 without complication-she will continue Lantus, sliding scale insulin.  #4 hypertension-she will continue losartan, metoprolol.  #5 depression-she will continue Seroquel.  #6 diabetic neuropathy-she will continue Lyrica  #7 GERD-she will continue Protonix.  #8 history of coronary artery disease- pt. Had no chest pain while in the hospital. She will Continue aspirin, Plavix, beta blocker, statin  DISCHARGE CONDITIONS:   Stable  CONSULTS OBTAINED:  Treatment Team:  Mertie Moores, MD  DRUG ALLERGIES:   Allergies  Allergen Reactions  . Bactrim [Sulfamethoxazole-Trimethoprim] Hives  . Sulfa Antibiotics Hives    DISCHARGE MEDICATIONS:   Discharge Medication List as of 08/25/2015 10:20 AM    CONTINUE these medications which have NOT CHANGED   Details  acetaminophen (TYLENOL) 500 MG tablet Take 1,000 mg by mouth every 8 (eight) hours as needed (arthritis pain)., Until Discontinued, Historical Med    albuterol (PROVENTIL HFA;VENTOLIN HFA) 108 (90 BASE) MCG/ACT inhaler Inhale 2 puffs into the lungs every 6 (six) hours as needed for wheezing or shortness of breath., Until Discontinued, Historical Med    aspirin EC 81 MG tablet Take 81 mg by mouth daily., Until Discontinued, Historical Med    atorvastatin (LIPITOR) 10 MG tablet Take 10 mg by mouth at bedtime., Until Discontinued, Historical Med    beclomethasone (QVAR) 80 MCG/ACT inhaler Inhale 2 puffs into the lungs 2 (two) times daily., Until Discontinued, Historical Med    cholecalciferol (VITAMIN D) 1000 UNITS tablet Take 1,000 Units by mouth daily.,  Until Discontinued, Historical Med    citalopram (CELEXA) 10 MG tablet Take 10 mg by mouth daily., Until Discontinued, Historical Med    clopidogrel (PLAVIX) 75 MG tablet Take 75 mg by mouth daily., Until  Discontinued, Historical Med    diclofenac sodium (VOLTAREN) 1 % GEL Apply 1 application topically 4 (four) times daily., Until Discontinued, Historical Med    docusate sodium (COLACE) 100 MG capsule Take 2 mg by mouth 2 (two) times daily as needed for mild constipation., Until Discontinued, Historical Med    insulin glargine (LANTUS) 100 UNIT/ML injection Inject 35-42 Units into the skin at bedtime. 42 units in the morning and 35 units at night, Until Discontinued, Historical Med    losartan (COZAAR) 25 MG tablet Take 12.5 mg by mouth daily., Until Discontinued, Historical Med    metFORMIN (GLUCOPHAGE) 500 MG tablet Take 500 mg by mouth 2 (two) times daily with a meal., Until Discontinued, Historical Med    metoprolol succinate (TOPROL-XL) 25 MG 24 hr tablet Take 50 mg by mouth daily., Until Discontinued, Historical Med    miconazole (MICOTIN) 2 % cream Apply 1 application topically 2 (two) times daily. Apply to feet, Until Discontinued, Historical Med    Multiple Vitamin (MULTIVITAMIN WITH MINERALS) TABS tablet Take 1 tablet by mouth at bedtime. , Until Discontinued, Historical Med    nitroGLYCERIN (NITROSTAT) 0.4 MG SL tablet Place 0.4 mg under the tongue every 5 (five) minutes as needed for chest pain., Until Discontinued, Historical Med    omeprazole (PRILOSEC) 40 MG capsule Take 40 mg by mouth daily., Until Discontinued, Historical Med    pregabalin (LYRICA) 50 MG capsule Take 50 mg by mouth 2 (two) times daily., Until Discontinued, Historical Med    QUEtiapine (SEROQUEL) 25 MG tablet Take 25-50 mg by mouth at bedtime., Until Discontinued, Historical Med         DISCHARGE INSTRUCTIONS:   DIET:  Cardiac diet and Diabetic diet  DISCHARGE CONDITION:  Stable  ACTIVITY:  Activity as tolerated  OXYGEN:  Home Oxygen: No.   Oxygen Delivery: room air  DISCHARGE LOCATION:  home   If you experience worsening of your admission symptoms, develop shortness of breath, life  threatening emergency, suicidal or homicidal thoughts you must seek medical attention immediately by calling 911 or calling your MD immediately  if symptoms less severe.  You Must read complete instructions/literature along with all the possible adverse reactions/side effects for all the Medicines you take and that have been prescribed to you. Take any new Medicines after you have completely understood and accpet all the possible adverse reactions/side effects.   Please note  You were cared for by a hospitalist during your hospital stay. If you have any questions about your discharge medications or the care you received while you were in the hospital after you are discharged, you can call the unit and asked to speak with the hospitalist on call if the hospitalist that took care of you is not available. Once you are discharged, your primary care physician will handle any further medical issues. Please note that NO REFILLS for any discharge medications will be authorized once you are discharged, as it is imperative that you return to your primary care physician (or establish a relationship with a primary care physician if you do not have one) for your aftercare needs so that they can reassess your need for medications and monitor your lab values.     Today   No abdominal pain, nausea/vomiting. No shortness of  breath. Positive cough with clear sputum production.   VITAL SIGNS:  Blood pressure 108/51, pulse 73, temperature 98.2 F (36.8 C), temperature source Oral, resp. rate 18, height  (1.549 m), weight 82.645 kg (182 lb 3.2 oz), SpO2 93 %.  I/O:   Intake/Output Summary (Last 24 hours) at 08/25/15 1441 Last data filed at 08/25/15 0736  Gross per 24 hour  Intake 2754.33 ml  Output      0 ml  Net 2754.33 ml    PHYSICAL EXAMINATION:   GENERAL: 81 y.o.-year-old patient lying in the bed with no acute distress.  EYES: Pupils equal, round, reactive to light and accommodation. No  scleral icterus. Extraocular muscles intact.  HEENT: Head atraumatic, normocephalic. Oropharynx and nasopharynx clear.  NECK: Supple, no jugular venous distention. No thyroid enlargement, no tenderness.  LUNGS: Normal breath sounds bilaterally, no wheezing, rales, rhonchi. No use of accessory muscles of respiration.  CARDIOVASCULAR: S1, S2 normal. No murmurs, rubs, or gallops.  ABDOMEN: Soft, nontender, nondistended. Bowel sounds present. No organomegaly or mass.  EXTREMITIES: No cyanosis, clubbing or edema b/l.  NEUROLOGIC: Cranial nerves II through XII are intact. No focal Motor or sensory deficits b/l.  PSYCHIATRIC: The patient is alert and oriented x 3. Good affect.  SKIN: No obvious rash, lesion, or ulcer.   DATA REVIEW:   CBC  Recent Labs Lab 08/23/15 2241  WBC 12.1*  HGB 12.3  HCT 37.4  PLT 265    Chemistries   Recent Labs Lab 08/23/15 2241  NA 132*  K 3.5  CL 96*  CO2 29  GLUCOSE 207*  BUN 18  CREATININE 0.62  CALCIUM 8.8*  AST 26  ALT 20  ALKPHOS 100  BILITOT 0.7    Cardiac Enzymes  Recent Labs Lab 08/23/15 2241  TROPONINI <0.03    Microbiology Results  No results found for this or any previous visit.  RADIOLOGY:  Dg Chest 2 View  08/23/2015  CLINICAL DATA:  Upper abdominal pain, nausea and vomiting. History of hypertension, diabetes. EXAM: CHEST  2 VIEW COMPARISON:  Chest radiograph Feb 09, 2007 FINDINGS: The cardiac silhouette is moderately enlarged, similar to slightly increased. Diffuse interstitial prominence appears chronic, without pleural effusion or focal consolidations. No pneumothorax. Soft tissue planes and included osseous structures are nonsuspicious, old approximate T9 compression fracture. IMPRESSION: Similar to slightly progressed cardiomegaly. Diffuse interstitial prominence, likely chronic though, can also be seen with atypical infection. Electronically Signed   By: Awilda Metro M.D.   On: 08/23/2015 23:22   Ct  Abdomen Pelvis W Contrast  08/24/2015  CLINICAL DATA:  Upper abdominal pain with EXAM: CT ABDOMEN AND PELVIS WITH CONTRAST TECHNIQUE: Multidetector CT imaging of the abdomen and pelvis was performed using the standard protocol following bolus administration of intravenous contrast. CONTRAST:  OMNIPAQUE IOHEXOL 300 MG/ML  SOLN COMPARISON:  None. FINDINGS: Lower chest: Increased AP diameter of the thorax. Heart at the upper limits normal in size. Linear atelectasis in the right lower lobe. Liver: No focal lesion. Hepatobiliary: Postcholecystectomy.  No biliary dilatation. Pancreas: No ductal dilatation or inflammation. Mild atrophy of the tail. Spleen: Normal. Adrenal glands: No nodule. Kidneys: Symmetric renal enhancement and excretion. No hydronephrosis. No perinephric stranding or focal renal abnormality. Stomach/Bowel: Stomach physiologically distended. There are no dilated or thickened small bowel loops. Moderate volume of stool throughout the colon without colonic wall thickening. The appendix is normal. Vascular/Lymphatic: No retroperitoneal adenopathy. Abdominal aorta is normal in caliber. Reproductive: Uterus appears surgically absent. No adnexal mass.  Ovaries not defined. Bladder: Physiologically distended, no wall thickening. Other: Fat containing umbilical hernia. Subcutaneous edema in the anterior abdominal wall in a pattern consistent with injections. No free air, free fluid, or intra-abdominal fluid collection. Musculoskeletal: There are no acute or suspicious osseous abnormalities. Degenerative change throughout spine. IMPRESSION: 1. No acute abnormality in the abdomen/pelvis. 2. Postcholecystectomy without biliary dilatation. Small fat containing umbilical hernia. Electronically Signed   By: Rubye Oaks M.D.   On: 08/24/2015 00:24   Ct Chest High Resolution  08/24/2015  CLINICAL DATA:  Acute respiratory failure and hypoxia. Nonsmoker. Interstitial opacities on recent chest radiograph  EXAM: CT CHEST WITHOUT CONTRAST TECHNIQUE: Multidetector CT imaging of the chest was performed following the standard protocol without intravenous contrast. High resolution imaging of the lungs, as well as inspiratory and expiratory imaging, was performed. COMPARISON:  Chest radiograph from one day prior. No prior chest CT. CT abdomen/pelvis from 1 day prior. FINDINGS: Mediastinum/Nodes: Mild cardiomegaly . No pericardial fluid/thickening. Left anterior descending coronary stent. Great vessels are normal in course and caliber. Normal visualized thyroid. There is fluid in the lower thoracic esophagus, suggesting esophageal dysmotility and/ or gastroesophageal reflux. No pathologically enlarged axillary, mediastinal or gross hilar lymph nodes, noting limited sensitivity for the detection of hilar adenopathy on this noncontrast study. Lungs/Pleura: No pneumothorax. No pleural effusion. No acute consolidative airspace disease, significant pulmonary nodules or lung masses. There patchy tree-in-bud opacities throughout both lungs, most prominent in the mid lung fields along the major fissures. There are scattered mild regions of subpleural reticulation and pleural thickening predominantly along the peripheral left upper and left lower lobes. There are a few small parenchymal bands in the basilar lower lobes with associated mild distortion in the medial right lower lobe. No significant regions of traction bronchiectasis or frank honeycombing. The expiration sequence is essentially nondiagnostic due to patient related factors (no significant expiration was performed). Upper abdomen: Unremarkable. Musculoskeletal: No aggressive appearing focal osseous lesions. There is a moderate to severe T9 vertebral compression fracture with approximately 70% anterior loss of vertebral body height, unchanged since 1 day prior. Moderate degenerative disc disease is seen throughout the thoracic spine. IMPRESSION: 1. Patchy tree-in-bud  opacities throughout both lungs, most prominent in the mid lung fields along the major fissures, indicating a nonspecific infectious or inflammatory bronchiolitis, with the differential including atypical mycobacterial infection (MAI). 2. Scattered mild regions of subpleural reticulation and pleural thickening predominantly in the peripheral left upper and left lower lobes. Small parenchymal bands in the basilar lower lobes with associated mild distortion in the medial right lower lobe. These findings are favored to be secondary to postinfectious/postinflammatory scarring. 3. Otherwise no evidence of interstitial lung disease, with no appreciable traction bronchiectasis or frank honeycombing. 4. Mild cardiomegaly.  One vessel coronary atherosclerosis. 5. Fluid in the lower thoracic esophagus, indicating esophageal dysmotility and/or gastroesophageal reflux. 6. Moderate to severe T9 vertebral compression fracture of indeterminate chronicity. Electronically Signed   By: Delbert Phenix M.D.   On: 08/24/2015 17:01      Management plans discussed with the patient, family and they are in agreement.  CODE STATUS:     Code Status Orders        Start     Ordered   08/24/15 0616  Full code   Continuous     08/24/15 0615      TOTAL TIME TAKING CARE OF THIS PATIENT: 40 minutes.    Houston Siren M.D on 08/25/2015 at 2:41 PM  Between 7am to 6pm -  Pager - [phone]  After 6pm go to [email or web address] - password [email or web address]  Fabio [facility]ors[facility] [place] [facility]  Office  [phone]  CC: Primary care physician; Pcp Not In System

## 2015-08-25 NOTE — Progress Notes (Signed)
Dr  Meredeth IdeFleming notified that patient is being discharged

## 2015-08-25 NOTE — Progress Notes (Signed)
Date: 08/25/2015,   MRN# [id number] Dana Holloway 1934/05/20 Code Status:     Code Status Orders        Start     Ordered   08/24/15 0616  Full code   Continuous     08/24/15 0615     Hosp day:@LENGTHOFSTAYDAYS @ Referring MD: @ATDPROV @        AdmissionWeight: 173 lb (78.472 kg)                 CurrentWeight: 182 lb 3.2 oz (82.645 kg)  CC: sob  HPI: This is a [age] year olf [place], [place]. She came in with increase nausse and abdominal pain, now resolved. In the ER was noted to have low sats, oxygen given. Today with ambulation her sats remained above 91 % on room air. She denies chest pain, calf pain, leg edema, wheezing, cough or  Recent long distance travel. She did mention that she was having elevated b/p and as a result she will notice increase shortness of breath.   PMHX:   Past Medical History  Diagnosis Date  . Hypertension   . Coronary artery disease   . Diabetes mellitus without complication (HCC)   . Arthritis   . Orbital osteo-periostitis   . Asthma   . Gastritis    Surgical Hx:  Past Surgical History  Procedure Laterality Date  . Cardiac surgery      cardiac cath with stent 2014  . Cholecystectomy    . Percutaneous coronary stent intervention (pci-s)     Family Hx:  Family History  Problem Relation Age of Onset  . Diabetes Mellitus II Other    Social Hx:   Social History  Substance Use Topics  . Smoking status: Never Smoker   . Smokeless tobacco: Never Used  . Alcohol Use: No   Medication:    Home Medication:  No current outpatient prescriptions on file.  Current Medication: @CURMEDTAB @   Allergies:  Bactrim and Sulfa antibiotics  Review of Systems: Gen:  Denies  fever, sweats, chills HEENT: Denies blurred vision, double vision, ear pain, eye pain, hearing loss, nose bleeds, sore throat Cvc:  No dizziness, chest pain or heaviness Resp: [place] 02, no sob, wheezing or chest pain   Gi: Denies swallowing  difficulty, stomach pain, nausea or vomiting, diarrhea, constipation, bowel incontinence Gu:  Denies bladder incontinence, burning urine Ext:   No Joint pain, stiffness or swelling Skin: No skin rash, easy bruising or bleeding or hives Endoc:  No polyuria, polydipsia , polyphagia or weight change Psych: No depression, insomnia or hallucinations  Other:  All other systems negative  Physical Examination:   VS: BP 108/51 mmHg  Pulse 73  Temp(Src) 98.2 F (36.8 C) (Oral)  Resp 18  Ht 5\' 1"  (1.549 m)  Wt 182 lb 3.2 oz (82.645 kg)  BMI 34.44 kg/m2  SpO2 93%  General Appearance: No distress  Neuro: without focal findings, mental status, speech normal, alert and oriented, cranial nerves 2-12 intact, reflexes normal and symmetric, sensation grossly normal  HEENT: PERRLA, EOM intact, no ptosis, no other lesions noticed, Mallampati: Pulmonary:.No wheezing, No rales  Sputum Production:   Cardiovascular:  Normal S1,S2.  No m/r/g.  Abdominal aorta pulsation normal.    Abdomen:Benign, Soft, non-tender, No masses, hepatosplenomegaly, No lymphadenopathy Endoc: No evident thyromegaly, no signs of acromegaly or Cushing features Skin:   warm, no rashes, no ecchymosis  Extremities: normal, no cyanosis, clubbing, no edema, warm with normal capillary  refill. Other findings:   Labs results:   Recent Labs     08/23/15  2241  HGB  12.3  HCT  37.4  MCV  81.2  WBC  12.1*  BUN  18  CREATININE  0.62  GLUCOSE  207*  CALCIUM  8.8*  ,     Rad results:   Ct Chest High Resolution  08/24/2015  CLINICAL DATA:  Acute respiratory failure and hypoxia. Nonsmoker. Interstitial opacities on recent chest radiograph EXAM: CT CHEST WITHOUT CONTRAST TECHNIQUE: Multidetector CT imaging of the chest was performed following the standard protocol without intravenous contrast. High resolution imaging of the lungs, as well as inspiratory and expiratory imaging, was performed. COMPARISON:  Chest radiograph from one day  prior. No prior chest CT. CT abdomen/pelvis from 1 day prior. FINDINGS: Mediastinum/Nodes: Mild cardiomegaly . No pericardial fluid/thickening. Left anterior descending coronary stent. Great vessels are normal in course and caliber. Normal visualized thyroid. There is fluid in the lower thoracic esophagus, suggesting esophageal dysmotility and/ or gastroesophageal reflux. No pathologically enlarged axillary, mediastinal or gross hilar lymph nodes, noting limited sensitivity for the detection of hilar adenopathy on this noncontrast study. Lungs/Pleura: No pneumothorax. No pleural effusion. No acute consolidative airspace disease, significant pulmonary nodules or lung masses. There patchy tree-in-bud opacities throughout both lungs, most prominent in the mid lung fields along the major fissures. There are scattered mild regions of subpleural reticulation and pleural thickening predominantly along the peripheral left upper and left lower lobes. There are a few small parenchymal bands in the basilar lower lobes with associated mild distortion in the medial right lower lobe. No significant regions of traction bronchiectasis or frank honeycombing. The expiration sequence is essentially nondiagnostic due to patient related factors (no significant expiration was performed). Upper abdomen: Unremarkable. Musculoskeletal: No aggressive appearing focal osseous lesions. There is a moderate to severe T9 vertebral compression fracture with approximately 70% anterior loss of vertebral body height, unchanged since 1 day prior. Moderate degenerative disc disease is seen throughout the thoracic spine. IMPRESSION: 1. Patchy tree-in-bud opacities throughout both lungs, most prominent in the mid lung fields along the major fissures, indicating a nonspecific infectious or inflammatory bronchiolitis, with the differential including atypical mycobacterial infection (MAI). 2. Scattered mild regions of subpleural reticulation and pleural  thickening predominantly in the peripheral left upper and left lower lobes. Small parenchymal bands in the basilar lower lobes with associated mild distortion in the medial right lower lobe. These findings are favored to be secondary to postinfectious/postinflammatory scarring. 3. Otherwise no evidence of interstitial lung disease, with no appreciable traction bronchiectasis or frank honeycombing. 4. Mild cardiomegaly.  One vessel coronary atherosclerosis. 5. Fluid in the lower thoracic esophagus, indicating esophageal dysmotility and/or gastroesophageal reflux. 6. Moderate to severe T9 vertebral compression fracture of indeterminate chronicity. Electronically Signed   By: Delbert Phenix M.D.   On: 08/24/2015 17:01      Assessment and Plan: On admission noted to have low sats. Chest ct noted above, no ILD, ? MAI presence. ?? Copd w/u in progress. She showed be safe to go home, f/u in pulmonary with pfts, sputum c/s, afb, wean o2 as tolerated. If not recently done will get echo. Will evaluate for sleep apnea on her return visit.    I have personally obtained a history, examined the patient, evaluated laboratory and imaging results, formulated the assessment and plan and placed orders.  The Patient requires high complexity decision making for assessment and support, frequent evaluation and titration of therapies, application of  advanced monitoring technologies and extensive interpretation of multiple databases.   Herbon Fleming,M.D. Pulmonary & Critical care Medicine [place]

## 2015-08-25 NOTE — Plan of Care (Signed)
Problem: Bowel/Gastric: Goal: Will not experience complications related to bowel motility Outcome: Progressing Pt alert and oriented. No c/o pain nor respiratory distress noted. Daughter at bedside. Pulmonary consult pending. Continue to monitor.

## 2015-11-15 ENCOUNTER — Encounter: Payer: Self-pay | Admitting: Emergency Medicine

## 2015-11-15 ENCOUNTER — Inpatient Hospital Stay
Admission: EM | Admit: 2015-11-15 | Discharge: 2015-11-17 | DRG: 190 | Disposition: A | Discharge status: Home / Self Care | Payer: Medicare Other | Attending: Internal Medicine | Admitting: Internal Medicine

## 2015-11-15 ENCOUNTER — Emergency Department: Payer: Medicare Other

## 2015-11-15 DIAGNOSIS — J441 Chronic obstructive pulmonary disease with (acute) exacerbation: Secondary | ICD-10-CM | POA: Diagnosis present

## 2015-11-15 DIAGNOSIS — Z955 Presence of coronary angioplasty implant and graft: Secondary | ICD-10-CM

## 2015-11-15 DIAGNOSIS — Z7982 Long term (current) use of aspirin: Secondary | ICD-10-CM | POA: Diagnosis not present

## 2015-11-15 DIAGNOSIS — I2510 Atherosclerotic heart disease of native coronary artery without angina pectoris: Secondary | ICD-10-CM | POA: Diagnosis present

## 2015-11-15 DIAGNOSIS — R0902 Hypoxemia: Secondary | ICD-10-CM

## 2015-11-15 DIAGNOSIS — I10 Essential (primary) hypertension: Secondary | ICD-10-CM | POA: Diagnosis present

## 2015-11-15 DIAGNOSIS — Z833 Family history of diabetes mellitus: Secondary | ICD-10-CM | POA: Diagnosis not present

## 2015-11-15 DIAGNOSIS — J9601 Acute respiratory failure with hypoxia: Secondary | ICD-10-CM | POA: Diagnosis present

## 2015-11-15 DIAGNOSIS — Z794 Long term (current) use of insulin: Secondary | ICD-10-CM | POA: Diagnosis not present

## 2015-11-15 DIAGNOSIS — E119 Type 2 diabetes mellitus without complications: Secondary | ICD-10-CM | POA: Diagnosis present

## 2015-11-15 LAB — COMPREHENSIVE METABOLIC PANEL
ALBUMIN: 3.7 g/dL (ref 3.5–5.0)
ALT: 15 U/L (ref 14–54)
ANION GAP: 6 (ref 5–15)
AST: 24 U/L (ref 15–41)
Alkaline Phosphatase: 92 U/L (ref 38–126)
BILIRUBIN TOTAL: 0.6 mg/dL (ref 0.3–1.2)
BUN: 18 mg/dL (ref 6–20)
CHLORIDE: 104 mmol/L (ref 101–111)
CO2: 27 mmol/L (ref 22–32)
Calcium: 8.7 mg/dL — ABNORMAL LOW (ref 8.9–10.3)
Creatinine, Ser: 0.76 mg/dL (ref 0.44–1.00)
GFR calc Af Amer: >60 mL/min (ref >60)
Glucose, Bld: 164 mg/dL — ABNORMAL HIGH (ref 65–99)
POTASSIUM: 3.8 mmol/L (ref 3.5–5.1)
Sodium: 137 mmol/L (ref 135–145)
TOTAL PROTEIN: 7 g/dL (ref 6.5–8.1)

## 2015-11-15 LAB — CBC WITH DIFFERENTIAL/PLATELET
BASOS ABS: 0.1 10*3/uL (ref 0–0.1)
BASOS PCT: 0 %
Eosinophils Absolute: 0.1 10*3/uL (ref 0–0.7)
Eosinophils Relative: 1 %
HEMATOCRIT: 33.7 % — AB (ref 35.0–47.0)
HEMOGLOBIN: 11.2 g/dL — AB (ref 12.0–16.0)
LYMPHS PCT: 8 %
Lymphs Abs: 1.1 10*3/uL (ref 1.0–3.6)
MCH: 26.5 pg (ref 26.0–34.0)
MCHC: 33.3 g/dL (ref 32.0–36.0)
MCV: 79.6 fL — AB (ref 80.0–100.0)
Monocytes Absolute: 0.9 10*3/uL (ref 0.2–0.9)
Monocytes Relative: 6 %
NEUTROS ABS: 11.8 10*3/uL — AB (ref 1.4–6.5)
NEUTROS PCT: 85 %
PLATELETS: 220 10*3/uL (ref 150–440)
RBC: 4.24 MIL/uL (ref 3.80–5.20)
RDW: 13.2 % (ref 11.5–14.5)
WBC: 14 10*3/uL — AB (ref 3.6–11.0)

## 2015-11-15 LAB — URINALYSIS COMPLETE WITH MICROSCOPIC (ARMC ONLY)
Bacteria, UA: NONE SEEN
Bilirubin Urine: NEGATIVE
Glucose, UA: >500 mg/dL — AB
Hgb urine dipstick: NEGATIVE
Ketones, ur: NEGATIVE mg/dL
Leukocytes, UA: NEGATIVE
Nitrite: NEGATIVE
PH: 5 (ref 5.0–8.0)
PROTEIN: NEGATIVE mg/dL
RBC / HPF: NONE SEEN RBC/hpf (ref 0–5)
SQUAMOUS EPITHELIAL / LPF: NONE SEEN
Specific Gravity, Urine: 1.011 (ref 1.005–1.030)

## 2015-11-15 LAB — GLUCOSE, CAPILLARY: Glucose-Capillary: 211 mg/dL — ABNORMAL HIGH (ref 65–99)

## 2015-11-15 LAB — TROPONIN I

## 2015-11-15 MED ORDER — LOSARTAN POTASSIUM 25 MG PO TABS
12.5000 mg | ORAL_TABLET | Freq: Every day | ORAL | Status: DC
Start: 2015-11-16 — End: 2015-11-17
  Administered 2015-11-16 – 2015-11-17 (×2): 12.5 mg via ORAL
  Filled 2015-11-15 (×2): qty 0.5

## 2015-11-15 MED ORDER — IPRATROPIUM-ALBUTEROL 0.5-2.5 (3) MG/3ML IN SOLN
3.0000 mL | Freq: Four times a day (QID) | RESPIRATORY_TRACT | Status: DC
  Administered 2015-11-16 – 2015-11-17 (×6): 3 mL via RESPIRATORY_TRACT
  Filled 2015-11-15 (×6): qty 3

## 2015-11-15 MED ORDER — METFORMIN HCL 500 MG PO TABS
500.0000 mg | ORAL_TABLET | Freq: Two times a day (BID) | ORAL | Status: DC
  Administered 2015-11-16 – 2015-11-17 (×3): 500 mg via ORAL
  Filled 2015-11-15 (×3): qty 1

## 2015-11-15 MED ORDER — METHYLPREDNISOLONE SODIUM SUCC 125 MG IJ SOLR
125.0000 mg | Freq: Once | INTRAMUSCULAR | Status: AC
  Administered 2015-11-15: 125 mg via INTRAVENOUS
  Filled 2015-11-15: qty 2

## 2015-11-15 MED ORDER — DOCUSATE SODIUM 100 MG PO CAPS
100.0000 mg | ORAL_CAPSULE | Freq: Two times a day (BID) | ORAL | Status: DC
  Administered 2015-11-15 – 2015-11-17 (×4): 100 mg via ORAL
  Filled 2015-11-15 (×4): qty 1

## 2015-11-15 MED ORDER — BUDESONIDE 0.25 MG/2ML IN SUSP
0.2500 mg | Freq: Two times a day (BID) | RESPIRATORY_TRACT | Status: DC
  Administered 2015-11-16 – 2015-11-17 (×2): 0.25 mg via RESPIRATORY_TRACT
  Filled 2015-11-15 (×2): qty 2

## 2015-11-15 MED ORDER — IPRATROPIUM-ALBUTEROL 0.5-2.5 (3) MG/3ML IN SOLN
3.0000 mL | Freq: Once | RESPIRATORY_TRACT | Status: AC
  Administered 2015-11-15: 3 mL via RESPIRATORY_TRACT
  Filled 2015-11-15: qty 3

## 2015-11-15 MED ORDER — METOPROLOL SUCCINATE ER 50 MG PO TB24
50.0000 mg | ORAL_TABLET | Freq: Every day | ORAL | Status: DC
  Administered 2015-11-16 – 2015-11-17 (×2): 50 mg via ORAL
  Filled 2015-11-15 (×2): qty 1

## 2015-11-15 MED ORDER — MICONAZOLE NITRATE 2 % EX CREA
1.0000 "application " | TOPICAL_CREAM | Freq: Two times a day (BID) | CUTANEOUS | Status: DC
  Administered 2015-11-16 – 2015-11-17 (×2): 1 via TOPICAL
  Filled 2015-11-15: qty 14

## 2015-11-15 MED ORDER — INSULIN ASPART 100 UNIT/ML ~~LOC~~ SOLN
0.0000 [IU] | Freq: Every day | SUBCUTANEOUS | Status: DC
  Administered 2015-11-15: 2 [IU] via SUBCUTANEOUS
  Filled 2015-11-15: qty 2

## 2015-11-15 MED ORDER — DEXTROSE 5 % IV SOLN
500.0000 mg | Freq: Once | INTRAVENOUS | Status: AC
  Administered 2015-11-16: 500 mg via INTRAVENOUS
  Filled 2015-11-15: qty 500

## 2015-11-15 MED ORDER — ONDANSETRON HCL 4 MG PO TABS: 4.0000 mg | ORAL_TABLET | Freq: Four times a day (QID) | ORAL | Status: DC | PRN

## 2015-11-15 MED ORDER — ENOXAPARIN SODIUM 40 MG/0.4ML ~~LOC~~ SOLN
40.0000 mg | SUBCUTANEOUS | Status: DC
  Administered 2015-11-15 – 2015-11-16 (×2): 40 mg via SUBCUTANEOUS
  Filled 2015-11-15 (×3): qty 0.4

## 2015-11-15 MED ORDER — INSULIN GLARGINE 100 UNIT/ML ~~LOC~~ SOLN
35.0000 [IU] | Freq: Every day | SUBCUTANEOUS | Status: DC
  Administered 2015-11-16 (×2): 35 [IU] via SUBCUTANEOUS
  Filled 2015-11-15 (×3): qty 0.35

## 2015-11-15 MED ORDER — INSULIN GLARGINE 100 UNIT/ML ~~LOC~~ SOLN
42.0000 [IU] | Freq: Every morning | SUBCUTANEOUS | Status: DC
  Administered 2015-11-16 – 2015-11-17 (×2): 42 [IU] via SUBCUTANEOUS
  Filled 2015-11-15 (×2): qty 0.42

## 2015-11-15 MED ORDER — ONDANSETRON HCL 4 MG/2ML IJ SOLN: 4.0000 mg | Freq: Four times a day (QID) | INTRAMUSCULAR | Status: DC | PRN

## 2015-11-15 MED ORDER — ADULT MULTIVITAMIN W/MINERALS CH
1.0000 | ORAL_TABLET | Freq: Every day | ORAL | Status: DC
  Administered 2015-11-15 – 2015-11-16 (×2): 1 via ORAL
  Filled 2015-11-15 (×2): qty 1

## 2015-11-15 MED ORDER — SODIUM CHLORIDE 0.9 % IV SOLN
INTRAVENOUS | Status: DC
  Administered 2015-11-15: via INTRAVENOUS

## 2015-11-15 MED ORDER — ALBUTEROL SULFATE HFA 108 (90 BASE) MCG/ACT IN AERS: 2.0000 | INHALATION_SPRAY | Freq: Four times a day (QID) | RESPIRATORY_TRACT | Status: DC | PRN

## 2015-11-15 MED ORDER — CLOPIDOGREL BISULFATE 75 MG PO TABS
75.0000 mg | ORAL_TABLET | Freq: Every day | ORAL | Status: DC
  Administered 2015-11-16 – 2015-11-17 (×2): 75 mg via ORAL
  Filled 2015-11-15 (×2): qty 1

## 2015-11-15 MED ORDER — INSULIN GLARGINE 100 UNIT/ML ~~LOC~~ SOLN: 35.0000 [IU] | Freq: Every day | SUBCUTANEOUS | Status: DC

## 2015-11-15 MED ORDER — ALBUTEROL SULFATE (2.5 MG/3ML) 0.083% IN NEBU: 2.5000 mg | INHALATION_SOLUTION | RESPIRATORY_TRACT | Status: DC | PRN

## 2015-11-15 MED ORDER — PANTOPRAZOLE SODIUM 40 MG PO TBEC
40.0000 mg | DELAYED_RELEASE_TABLET | Freq: Every day | ORAL | Status: DC
  Administered 2015-11-16 – 2015-11-17 (×2): 40 mg via ORAL
  Filled 2015-11-15 (×2): qty 1

## 2015-11-15 MED ORDER — VITAMIN D 1000 UNITS PO TABS
1000.0000 [IU] | ORAL_TABLET | Freq: Every day | ORAL | Status: DC
  Administered 2015-11-16 – 2015-11-17 (×2): 1000 [IU] via ORAL
  Filled 2015-11-15 (×2): qty 1

## 2015-11-15 MED ORDER — DOCUSATE SODIUM 100 MG PO CAPS: 100.0000 mg | ORAL_CAPSULE | Freq: Two times a day (BID) | ORAL | Status: DC | PRN

## 2015-11-15 MED ORDER — DOCUSATE SODIUM 100 MG PO CAPS: 2.0000 mg | ORAL_CAPSULE | Freq: Two times a day (BID) | ORAL | Status: DC | PRN

## 2015-11-15 MED ORDER — SODIUM CHLORIDE 0.9% FLUSH
3.0000 mL | Freq: Two times a day (BID) | INTRAVENOUS | Status: DC
Start: 2015-11-15 — End: 2015-11-17
  Administered 2015-11-15 – 2015-11-17 (×3): 3 mL via INTRAVENOUS

## 2015-11-15 MED ORDER — ATORVASTATIN CALCIUM 10 MG PO TABS
10.0000 mg | ORAL_TABLET | Freq: Every day | ORAL | Status: DC
  Administered 2015-11-15 – 2015-11-16 (×2): 10 mg via ORAL
  Filled 2015-11-15 (×2): qty 1

## 2015-11-15 MED ORDER — DICLOFENAC SODIUM 1 % TD GEL
1.0000 "application " | Freq: Four times a day (QID) | TRANSDERMAL | Status: DC
  Administered 2015-11-16 – 2015-11-17 (×2): 1 via TOPICAL
  Filled 2015-11-15: qty 100

## 2015-11-15 MED ORDER — METHYLPREDNISOLONE SODIUM SUCC 40 MG IJ SOLR
40.0000 mg | Freq: Three times a day (TID) | INTRAMUSCULAR | Status: DC
  Administered 2015-11-16: 40 mg via INTRAVENOUS
  Filled 2015-11-15: qty 1

## 2015-11-15 MED ORDER — ACETAMINOPHEN 325 MG PO TABS
650.0000 mg | ORAL_TABLET | Freq: Three times a day (TID) | ORAL | Status: DC | PRN
Start: 2015-11-15 — End: 2015-11-17
  Administered 2015-11-16: 650 mg via ORAL
  Filled 2015-11-15: qty 2

## 2015-11-15 MED ORDER — ASPIRIN EC 81 MG PO TBEC
81.0000 mg | DELAYED_RELEASE_TABLET | Freq: Every day | ORAL | Status: DC
  Administered 2015-11-16 – 2015-11-17 (×2): 81 mg via ORAL
  Filled 2015-11-15 (×2): qty 1

## 2015-11-15 MED ORDER — BECLOMETHASONE DIPROPIONATE 80 MCG/ACT IN AERS: 2.0000 | INHALATION_SPRAY | Freq: Two times a day (BID) | RESPIRATORY_TRACT | Status: DC

## 2015-11-15 MED ORDER — QUETIAPINE FUMARATE 25 MG PO TABS
25.0000 mg | ORAL_TABLET | Freq: Every day | ORAL | Status: DC
  Administered 2015-11-16 (×2): 50 mg via ORAL
  Filled 2015-11-15 (×2): qty 1
  Filled 2015-11-15: qty 2

## 2015-11-15 MED ORDER — AZITHROMYCIN 250 MG PO TABS
250.0000 mg | ORAL_TABLET | Freq: Every day | ORAL | Status: DC
  Administered 2015-11-16 – 2015-11-17 (×2): 250 mg via ORAL
  Filled 2015-11-15 (×2): qty 1

## 2015-11-15 MED ORDER — NITROGLYCERIN 0.4 MG SL SUBL: 0.4000 mg | SUBLINGUAL_TABLET | SUBLINGUAL | Status: DC | PRN

## 2015-11-15 MED ORDER — CITALOPRAM HYDROBROMIDE 20 MG PO TABS
10.0000 mg | ORAL_TABLET | Freq: Every day | ORAL | Status: DC
  Administered 2015-11-16 – 2015-11-17 (×2): 10 mg via ORAL
  Filled 2015-11-15 (×2): qty 1

## 2015-11-15 MED ORDER — PREGABALIN 50 MG PO CAPS
50.0000 mg | ORAL_CAPSULE | Freq: Two times a day (BID) | ORAL | Status: DC
  Administered 2015-11-16 – 2015-11-17 (×3): 50 mg via ORAL
  Filled 2015-11-15 (×3): qty 1

## 2015-11-15 MED ORDER — INSULIN ASPART 100 UNIT/ML ~~LOC~~ SOLN
0.0000 [IU] | Freq: Three times a day (TID) | SUBCUTANEOUS | Status: DC
  Administered 2015-11-16: 12:00:00 5 [IU] via SUBCUTANEOUS
  Administered 2015-11-16: 08:00:00 8 [IU] via SUBCUTANEOUS
  Administered 2015-11-16: 17:00:00 2 [IU] via SUBCUTANEOUS
  Filled 2015-11-15: qty 5
  Filled 2015-11-15: qty 8
  Filled 2015-11-15: qty 2

## 2015-11-15 NOTE — ED Notes (Signed)
Spanish interpreter at bedside.  Duoneb in progress.  Pt and family aware of UA specimen needed.

## 2015-11-15 NOTE — ED Notes (Signed)
Reports weakness, headache and low O2 sats at home.

## 2015-11-15 NOTE — ED Provider Notes (Addendum)
[facility] Emergency Department Provider Note   ____________________________________________  Time seen: Approximately 6pm I have reviewed the triage vital signs and the triage nursing note.  HISTORY  Chief Complaint Headache   Historian Patient and daughter through spanish interpreter  HPI Dana Holloway is a 82 y.o. female with history of obstructive pulmonary disease per patients' spanish language paperwork, who is here for feeling generalized weakness for a few days and headache, global for several days.  She took tylenol before arrival and headache is currently 4/10.  Daughter checked bp and glucose which were ok.  o2 sat was 85%.  Shes had a moderate cough, non productive.denies fevers, denies sick contacts.    Past Medical History  Diagnosis Date  . Hypertension   . Coronary artery disease   . Diabetes mellitus without complication (HCC)   . Arthritis   . Orbital osteo-periostitis   . Asthma   . Gastritis     Patient Active Problem List   Diagnosis Date Noted  . Hypoxia 08/24/2015    Past Surgical History  Procedure Laterality Date  . Cardiac surgery      cardiac cath with stent 2014  . Cholecystectomy    . Percutaneous coronary stent intervention (pci-s)      Current Outpatient Rx  Name  Route  Sig  Dispense  Refill  . acetaminophen (TYLENOL) 500 MG tablet   Oral   Take 1,000 mg by mouth every 8 (eight) hours as needed (arthritis pain).         Marland Kitchen albuterol (PROVENTIL HFA;VENTOLIN HFA) 108 (90 BASE) MCG/ACT inhaler   Inhalation   Inhale 2 puffs into the lungs every 6 (six) hours as needed for wheezing or shortness of breath.         Marland Kitchen aspirin EC 81 MG tablet   Oral   Take 81 mg by mouth daily.         Marland Kitchen atorvastatin (LIPITOR) 10 MG tablet   Oral   Take 10 mg by mouth at bedtime.         . beclomethasone (QVAR) 80 MCG/ACT inhaler   Inhalation   Inhale 2 puffs into the lungs 2 (two) times daily.          . cholecalciferol (VITAMIN D) 1000 UNITS tablet   Oral   Take 1,000 Units by mouth daily.         . citalopram (CELEXA) 10 MG tablet   Oral   Take 10 mg by mouth daily.         . clopidogrel (PLAVIX) 75 MG tablet   Oral   Take 75 mg by mouth daily.         . diclofenac sodium (VOLTAREN) 1 % GEL   Topical   Apply 1 application topically 4 (four) times daily.         Marland Kitchen docusate sodium (COLACE) 100 MG capsule   Oral   Take 2 mg by mouth 2 (two) times daily as needed for mild constipation.         . insulin glargine (LANTUS) 100 UNIT/ML injection   Subcutaneous   Inject 35-42 Units into the skin at bedtime. 42 units in the morning and 35 units at night         . losartan (COZAAR) 25 MG tablet   Oral   Take 12.5 mg by mouth daily.         . metFORMIN (GLUCOPHAGE) 500 MG tablet   Oral  Take 500 mg by mouth 2 (two) times daily with a meal.         . metoprolol succinate (TOPROL-XL) 25 MG 24 hr tablet   Oral   Take 50 mg by mouth daily.         . miconazole (MICOTIN) 2 % cream   Topical   Apply 1 application topically 2 (two) times daily. Apply to feet         . Multiple Vitamin (MULTIVITAMIN WITH MINERALS) TABS tablet   Oral   Take 1 tablet by mouth at bedtime.          . nitroGLYCERIN (NITROSTAT) 0.4 MG SL tablet   Sublingual   Place 0.4 mg under the tongue every 5 (five) minutes as needed for chest pain.         Marland Kitchen omeprazole (PRILOSEC) 40 MG capsule   Oral   Take 40 mg by mouth daily.         . pregabalin (LYRICA) 50 MG capsule   Oral   Take 50 mg by mouth 2 (two) times daily.         . QUEtiapine (SEROQUEL) 25 MG tablet   Oral   Take 25-50 mg by mouth at bedtime.           Allergies Bactrim and Sulfa antibiotics  Family History  Problem Relation Age of Onset  . Diabetes Mellitus II Other     Social History Social History  Substance Use Topics  . Smoking status: Never Smoker   . Smokeless tobacco: Never Used  .  Alcohol Use: No    Review of Systems  Constitutional: Negative for fever. Eyes: Negative for visual changes. ENT: negative for sore throat. Cardiovascular: Negative for chest pain. Respiratory: Pos for shortness of breath. Gastrointestinal: Negative for abdominal pain, vomiting and diarrhea. Genitourinary: Negative for dysuria. Musculoskeletal: Negative for back pain. Skin: Negative for rash. Neurological: pos for headache. 10 point Review of Systems otherwise negative ____________________________________________   PHYSICAL EXAM:  VITAL SIGNS: ED Triage Vitals  Enc Vitals Group     BP 11/15/15 1720 130/58 mmHg     Pulse Rate 11/15/15 1720 103     Resp 11/15/15 1720 20     Temp 11/15/15 1720 98.8 F (37.1 C)     Temp src --      SpO2 11/15/15 1720 88 %     Weight 11/15/15 1720 160 lb (72.576 kg)     Height 11/15/15 1720  (1.575 m)     Head Cir --      Peak Flow --      Pain Score 11/15/15 1721 6     Pain Loc --      Pain Edu? --      Excl. in GC? --      Constitutional: Alert and oriented. Well appearing and in no distress. HEENT   Head: Normocephalic and atraumatic.      Eyes: Conjunctivae are normal. PERRL. Normal extraocular movements.      Ears:         Nose: No congestion/rhinnorhea.   Mouth/Throat: Mucous membranes are moist.   Neck: No stridor. Cardiovascular/Chest: Normal rate, regular rhythm.  No murmurs, rubs, or gallops. Respiratory: Normal respiratory effort without tachypnea nor retractions. Intermittent Roncherous cough.  Mild end expiratory wheeze posteriorly. Gastrointestinal: Soft. No distention, no guarding, no rebound. Nontender.    Genitourinary/rectal:Deferred Musculoskeletal: Nontender with normal range of motion in all extremities. No joint effusions.  No lower extremity  tenderness.  No edema. Neurologic:  Normal speech and language. No gross or focal neurologic deficits are appreciated. Skin:  Skin is warm, dry and intact.  No rash noted. Psychiatric: Mood and affect are normal. Speech and behavior are normal. Patient exhibits appropriate insight and judgment.  ____________________________________________   EKG I, Governor Rooks, MD, the attending physician have personally viewed and interpreted all ECGs.  101 sinus tachy.  Narrow qrs.  Nl axis.  Nonspecific t wave ____________________________________________  LABS (pertinent positives/negatives)  Wbc 14.0, hg 11.2 Troponin less than 0.03 , Metabolic panel without significant abnormality  ____________________________________________  RADIOLOGY All Xrays were viewed by me. Imaging interpreted by Radiologist.  Chest 2 view:  No evidence acute cardio pulmonary disease __________________________________________  PROCEDURES  Procedure(s) performed: None  Critical Care performed: None  ____________________________________________   ED COURSE / ASSESSMENT AND PLAN  Pertinent labs & imaging results that were available during my care of the patient were reviewed by me and considered in my medical decision making (see chart for details).   o2 sat low to 85% at home, 88% at rest here.  Suspect copd exacerbation vs pneumonia or other acute cardiopulmonary cause of shortness of breath.    3 duonebs, still with o2 requirement and shortness of breath and increased work of breathing.  Will admit for observation.    CONSULTATIONS:   Hospitalist for admission   Patient / Family / Caregiver informed of clinical course, medical decision-making process, and agree with plan.     ___________________________________________   FINAL CLINICAL IMPRESSION(S) / ED DIAGNOSES   Final diagnoses:  COPD exacerbation (HCC)  Hypoxia              Note: This dictation was prepared with Dragon dictation. Any transcriptional errors that result from this process are unintentional   Governor Rooks, MD 11/15/15 2020  Governor Rooks, MD 11/15/15 2050

## 2015-11-15 NOTE — H&P (Signed)
[facility] - [place] at [facility]   PATIENT NAME: Dana Holloway    MR#:  [id number]  DATE OF BIRTH:  1934-05-27  DATE OF ADMISSION:  11/15/2015  PRIMARY CARE PHYSICIAN: [facility]   REQUESTING/REFERRING PHYSICIAN: Dr. Shaune Pollack  CHIEF COMPLAINT:   Shortness of breath and cough HISTORY OF PRESENT ILLNESS:  Vasilia Dise  is a 82 y.o. female with a known history of coronary artery disease, insulin requiring diabetes mellitus, hypertension and COPD is presenting to the ED with a chief complaint of shortness of breath associated with productive cough. Patient was hypoxic with 85% pulse ox on room air as reported by the daughter and in the ED she was at 88%. Patient was placed on oxygen and Solu-Medrol was given. Following nebulizer treatments patient was jittery but started feeling better. As patient was still with exertional dyspnea hospitalist team is called to admit the patient. Patient denies any fever and no sick contacts. Denies any chest pain or shortness of breath  PAST MEDICAL HISTORY:   Past Medical History  Diagnosis Date  . Hypertension   . Coronary artery disease   . Diabetes mellitus without complication (HCC)   . Arthritis   . Orbital osteo-periostitis   . Asthma   . Gastritis     PAST SURGICAL HISTOIRY:   Past Surgical History  Procedure Laterality Date  . Cardiac surgery      cardiac cath with stent 2014  . Cholecystectomy    . Percutaneous coronary stent intervention (pci-s)      SOCIAL HISTORY:   Social History  Substance Use Topics  . Smoking status: Never Smoker   . Smokeless tobacco: Never Used  . Alcohol Use: No    FAMILY HISTORY:   Family History  Problem Relation Age of Onset  . Diabetes Mellitus II Other     DRUG ALLERGIES:   Allergies  Allergen Reactions  . Bactrim [Sulfamethoxazole-Trimethoprim] Hives  . Sulfa Antibiotics Hives    REVIEW OF SYSTEMS:  CONSTITUTIONAL:  No fever, fatigue or weakness.  EYES: No blurred or double vision.  EARS, NOSE, AND THROAT: No tinnitus or ear pain.  RESPIRATORY: Reporting productive cough, shortness of breath, wheezing , denies hemoptysis.  CARDIOVASCULAR: No chest pain, orthopnea, edema.  GASTROINTESTINAL: No nausea, vomiting, diarrhea or abdominal pain.  GENITOURINARY: No dysuria, hematuria.  ENDOCRINE: No polyuria, nocturia,  HEMATOLOGY: No anemia, easy bruising or bleeding SKIN: No rash or lesion. MUSCULOSKELETAL: No joint pain or arthritis.   NEUROLOGIC: No tingling, numbness, weakness.  PSYCHIATRY: No anxiety or depression.   MEDICATIONS AT HOME:   Prior to Admission medications   Medication Sig Start Date End Date Taking? Authorizing Provider  acetaminophen (TYLENOL) 325 MG tablet Take 650 mg by mouth 3 (three) times daily as needed for mild pain or moderate pain.   Yes Historical Provider, MD  albuterol (PROVENTIL HFA;VENTOLIN HFA) 108 (90 BASE) MCG/ACT inhaler Inhale 2 puffs into the lungs every 6 (six) hours as needed for wheezing or shortness of breath.   Yes Historical Provider, MD  aspirin EC 81 MG tablet Take 81 mg by mouth daily.   Yes Historical Provider, MD  atorvastatin (LIPITOR) 10 MG tablet Take 10 mg by mouth at bedtime.   Yes Historical Provider, MD  beclomethasone (QVAR) 80 MCG/ACT inhaler Inhale 2 puffs into the lungs 2 (two) times daily.   Yes Historical Provider, MD  cholecalciferol (VITAMIN D) 1000 UNITS tablet Take 1,000 Units by mouth  daily.   Yes Historical Provider, MD  citalopram (CELEXA) 10 MG tablet Take 10 mg by mouth daily.   Yes Historical Provider, MD  clopidogrel (PLAVIX) 75 MG tablet Take 75 mg by mouth daily.   Yes Historical Provider, MD  diclofenac sodium (VOLTAREN) 1 % GEL Apply 1 application topically 4 (four) times daily.   Yes Historical Provider, MD  docusate sodium (COLACE) 100 MG capsule Take 2 mg by mouth 2 (two) times daily as needed for mild constipation.   Yes  Historical Provider, MD  insulin glargine (LANTUS) 100 UNIT/ML injection Inject 35-42 Units into the skin at bedtime. 42 units in the morning and 35 units at night   Yes Historical Provider, MD  losartan (COZAAR) 25 MG tablet Take 12.5 mg by mouth daily.   Yes Historical Provider, MD  metFORMIN (GLUCOPHAGE) 500 MG tablet Take 500 mg by mouth 2 (two) times daily with a meal.   Yes Historical Provider, MD  metoprolol succinate (TOPROL-XL) 25 MG 24 hr tablet Take 50 mg by mouth daily.   Yes Historical Provider, MD  miconazole (MICOTIN) 2 % cream Apply 1 application topically 2 (two) times daily. Apply to feet   Yes Historical Provider, MD  Multiple Vitamin (MULTIVITAMIN WITH MINERALS) TABS tablet Take 1 tablet by mouth at bedtime.    Yes Historical Provider, MD  nitroGLYCERIN (NITROSTAT) 0.4 MG SL tablet Place 0.4 mg under the tongue every 5 (five) minutes as needed for chest pain.   Yes Historical Provider, MD  omeprazole (PRILOSEC) 40 MG capsule Take 40 mg by mouth daily.   Yes Historical Provider, MD  pregabalin (LYRICA) 50 MG capsule Take 50 mg by mouth 2 (two) times daily.   Yes Historical Provider, MD  QUEtiapine (SEROQUEL) 25 MG tablet Take 25-50 mg by mouth at bedtime.   Yes Historical Provider, MD      VITAL SIGNS:  Blood pressure 121/55, pulse 102, temperature 98.5 F (36.9 C), temperature source Oral, resp. rate 18, height  (1.575 m), weight 72.576 kg (160 lb), SpO2 96 %.  PHYSICAL EXAMINATION:  GENERAL:  82 y.o.-year-old patient lying in the bed with no acute distress.  EYES: Pupils equal, round, reactive to light and accommodation. No scleral icterus. Extraocular muscles intact.  HEENT: Head atraumatic, normocephalic. Oropharynx and nasopharynx clear.  NECK:  Supple, no jugular venous distention. No thyroid enlargement, no tenderness.  LUNGS: Coarse breath sounds bilaterally, minimal wheezing, no rales,rhonchi or crepitation. No use of accessory muscles of respiration.   CARDIOVASCULAR: S1, S2 normal. No murmurs, rubs, or gallops.  ABDOMEN: Soft, nontender, nondistended. Bowel sounds present. No organomegaly or mass.  EXTREMITIES: No pedal edema, cyanosis, or clubbing.  NEUROLOGIC: Cranial nerves II through XII are intact. Muscle strength 5/5 in all extremities. Sensation intact. Gait not checked.  PSYCHIATRIC: The patient is alert and oriented x 3.  SKIN: No obvious rash, lesion, or ulcer.   LABORATORY PANEL:   CBC  Recent Labs Lab 11/15/15 1857  WBC 14.0*  HGB 11.2*  HCT 33.7*  PLT 220   ------------------------------------------------------------------------------------------------------------------  Chemistries   Recent Labs Lab 11/15/15 1857  NA 137  K 3.8  CL 104  CO2 27  GLUCOSE 164*  BUN 18  CREATININE 0.76  CALCIUM 8.7*  AST 24  ALT 15  ALKPHOS 92  BILITOT 0.6   ------------------------------------------------------------------------------------------------------------------  Cardiac Enzymes  Recent Labs Lab 11/15/15 1857  TROPONINI <0.03   ------------------------------------------------------------------------------------------------------------------  RADIOLOGY:  Dg Chest 2 View  11/15/2015  CLINICAL DATA:  Shortness of breath, hypoxia EXAM: CHEST  2 VIEW COMPARISON:  CT chest dated 08/24/2015 FINDINGS: Chronic interstitial markings. No focal consolidation. No pleural effusion or pneumothorax. Heart is top-normal in size. Degenerative changes of the visualized thoracolumbar spine. Moderate compression fracture deformity at T9, unchanged. IMPRESSION: No evidence of acute cardiopulmonary disease. Electronically Signed   By: Charline Bills M.D.   On: 11/15/2015 19:01    EKG:   Orders placed or performed during the hospital encounter of 11/15/15  . EKG 12-Lead  . EKG 12-Lead    IMPRESSION AND PLAN:    1. Acute respiratory failure with hypoxia secondary to COPD exacerbation Continue oxygen via nasal  cannula to maintain her sats at 90% IV Solu-Medrol, DuoNeb nebulizer treatments and IV azithromycin will be given Await clinical improvement. We will obtain sputum culture and sensitivity  2. Essential hypertension- Blood pressure is stable. Continue home medications Cozaar and metoprolol and titrate as needed basis  3. History of diabetes mellitus-insulin-dependent Continue her home medications Lantus and patient will be on moderate sliding scale Diabetic diet Check hemoglobin A1c in a.m.  4. History of coronary artery disease, status post stent -currently patient is asymptomatic Continue her home medications aspirin, Plavix, beta blocker and statin   All the records are reviewed and case discussed with ED provider. Management plans discussed with the patient, and her daughter at bedside with the help of Spanish interpreter Mr. Rafael. All their questions were answered to their satisfaction. They both verbalized understanding of the plan  CODE STATUS: fc, daughter is the healthcare power of attorney  TOTAL TIME TAKING CARE OF THIS PATIENT: 45 minutes.    Ramonita Lab M.D on 11/15/2015 at 10:07 PM  Between 7am to 6pm - Pager - [phone]  After 6pm go to [email or web address] - password [email or web address] [facility]  [place] [facility]  Office  [phone]  CC: Primary care physician; [facility]

## 2015-11-16 DIAGNOSIS — R0902 Hypoxemia: Secondary | ICD-10-CM | POA: Diagnosis not present

## 2015-11-16 DIAGNOSIS — J441 Chronic obstructive pulmonary disease with (acute) exacerbation: Secondary | ICD-10-CM | POA: Diagnosis not present

## 2015-11-16 LAB — EXPECTORATED SPUTUM ASSESSMENT W REFEX TO RESP CULTURE

## 2015-11-16 LAB — BASIC METABOLIC PANEL
ANION GAP: 8 (ref 5–15)
BUN: 19 mg/dL (ref 6–20)
CO2: 23 mmol/L (ref 22–32)
Calcium: 8.6 mg/dL — ABNORMAL LOW (ref 8.9–10.3)
Chloride: 105 mmol/L (ref 101–111)
Creatinine, Ser: 0.79 mg/dL (ref 0.44–1.00)
GFR calc Af Amer: >60 mL/min (ref >60)
GLUCOSE: 331 mg/dL — AB (ref 65–99)
POTASSIUM: 3.9 mmol/L (ref 3.5–5.1)
Sodium: 136 mmol/L (ref 135–145)

## 2015-11-16 LAB — CBC
HEMATOCRIT: 32.9 % — AB (ref 35.0–47.0)
HEMOGLOBIN: 10.8 g/dL — AB (ref 12.0–16.0)
MCH: 26.3 pg (ref 26.0–34.0)
MCHC: 32.9 g/dL (ref 32.0–36.0)
MCV: 79.8 fL — ABNORMAL LOW (ref 80.0–100.0)
Platelets: 208 10*3/uL (ref 150–440)
RBC: 4.12 MIL/uL (ref 3.80–5.20)
RDW: 13.6 % (ref 11.5–14.5)
WBC: 14.7 10*3/uL — ABNORMAL HIGH (ref 3.6–11.0)

## 2015-11-16 LAB — GLUCOSE, CAPILLARY
GLUCOSE-CAPILLARY: 264 mg/dL — AB (ref 65–99)
GLUCOSE-CAPILLARY: 170 mg/dL — AB (ref 65–99)
Glucose-Capillary: 210 mg/dL — ABNORMAL HIGH (ref 65–99)
Glucose-Capillary: 140 mg/dL — ABNORMAL HIGH (ref 65–99)

## 2015-11-16 LAB — EXPECTORATED SPUTUM ASSESSMENT W GRAM STAIN, RFLX TO RESP C

## 2015-11-16 LAB — HEMOGLOBIN A1C: HEMOGLOBIN A1C: 6.8 % — AB (ref 4.0–6.0)

## 2015-11-16 MED ORDER — PREDNISONE 20 MG PO TABS
40.0000 mg | ORAL_TABLET | Freq: Every day | ORAL | Status: DC
  Administered 2015-11-17: 40 mg via ORAL
  Filled 2015-11-16: qty 2

## 2015-11-16 NOTE — Progress Notes (Signed)
Sputum specimen obtained

## 2015-11-16 NOTE — Progress Notes (Signed)
[facility] - [place] at [facility]   PATIENT NAME: Lataria Courser    MR#:  [id number]  DATE OF BIRTH:  11-Jul-1934  SUBJECTIVE:  CHIEF COMPLAINT:   Chief Complaint  Patient presents with  . Headache   better cough and shortness of breath, no wheezing.  REVIEW OF SYSTEMS:  CONSTITUTIONAL: No fever, fatigue or weakness.  EYES: No blurred or double vision.  EARS, NOSE, AND THROAT: No tinnitus or ear pain.  RESPIRATORY: has cough, shortness of breath, no wheezing or hemoptysis.  CARDIOVASCULAR: No chest pain, orthopnea, edema.  GASTROINTESTINAL: No nausea, vomiting, diarrhea or abdominal pain.  GENITOURINARY: No dysuria, hematuria.  ENDOCRINE: No polyuria, nocturia,  HEMATOLOGY: No anemia, easy bruising or bleeding SKIN: No rash or lesion. MUSCULOSKELETAL: No joint pain or arthritis.   NEUROLOGIC: No tingling, numbness, weakness.  PSYCHIATRY: No anxiety or depression.   DRUG ALLERGIES:   Allergies  Allergen Reactions  . Bactrim [Sulfamethoxazole-Trimethoprim] Hives  . Sulfa Antibiotics Hives    VITALS:  Blood pressure 104/55, pulse 104, temperature 98 F (36.7 C), temperature source Oral, resp. rate 20, height  (1.6 m), weight 76.794 kg (169 lb 4.8 oz), SpO2 93 %.  PHYSICAL EXAMINATION:  GENERAL:  82 y.o.-year-old patient lying in the bed with no acute distress.  EYES: Pupils equal, round, reactive to light and accommodation. No scleral icterus. Extraocular muscles intact.  HEENT: Head atraumatic, normocephalic. Oropharynx and nasopharynx clear.  NECK:  Supple, no jugular venous distention. No thyroid enlargement, no tenderness.  LUNGS: Normal breath sounds bilaterally, mild  wheezing, no rales,rhonchi or crepitation. No use of accessory muscles of respiration.  CARDIOVASCULAR: S1, S2 normal. No murmurs, rubs, or gallops.  ABDOMEN: Soft, nontender, nondistended. Bowel sounds present. No organomegaly or mass.  EXTREMITIES: No pedal  edema, cyanosis, or clubbing.  NEUROLOGIC: Cranial nerves II through XII are intact. Muscle strength 5/5 in all extremities. Sensation intact. Gait not checked.  PSYCHIATRIC: The patient is alert and oriented x 3.  SKIN: No obvious rash, lesion, or ulcer.    LABORATORY PANEL:   CBC  Recent Labs Lab 11/16/15 0438  WBC 14.7*  HGB 10.8*  HCT 32.9*  PLT 208   ------------------------------------------------------------------------------------------------------------------  Chemistries   Recent Labs Lab 11/15/15 1857 11/16/15 0438  NA 137 136  K 3.8 3.9  CL 104 105  CO2 27 23  GLUCOSE 164* 331*  BUN 18 19  CREATININE 0.76 0.79  CALCIUM 8.7* 8.6*  AST 24  --   ALT 15  --   ALKPHOS 92  --   BILITOT 0.6  --    ------------------------------------------------------------------------------------------------------------------  Cardiac Enzymes  Recent Labs Lab 11/15/15 1857  TROPONINI <0.03   ------------------------------------------------------------------------------------------------------------------  RADIOLOGY:  Dg Chest 2 View  11/15/2015  CLINICAL DATA:  Shortness of breath, hypoxia EXAM: CHEST  2 VIEW COMPARISON:  CT chest dated 08/24/2015 FINDINGS: Chronic interstitial markings. No focal consolidation. No pleural effusion or pneumothorax. Heart is top-normal in size. Degenerative changes of the visualized thoracolumbar spine. Moderate compression fracture deformity at T9, unchanged. IMPRESSION: No evidence of acute cardiopulmonary disease. Electronically Signed   By: Charline Bills M.D.   On: 11/15/2015 19:01    EKG:   Orders placed or performed during the hospital encounter of 11/15/15  . EKG 12-Lead  . EKG 12-Lead    ASSESSMENT AND PLAN:   1. Acute respiratory failure with hypoxia secondary to COPD exacerbation Try to wean off oxygen via nasal cannula. Discontinue IV Solu-Medrol, changed  to prednisone, continue DuoNeb nebulizer treatments and  azithromycin.   2. Essential hypertension Blood pressure is stable. Continue home medications Cozaar and metoprolol and titrate as needed basis  3. History of diabetes mellitus-insulin-dependent Continue her home medications Lantus, she is on moderate sliding scale  4. History of coronary artery disease, status post stent -currently patient is asymptomatic Continue her home medications aspirin, Plavix, beta blocker and statin   All the records are reviewed and case discussed with Care Management/Social Workerr. Management plans discussed with the patient, her daugter and they are in agreement.  CODE STATUS: Full code  TOTAL TIME TAKING CARE OF THIS PATIENT: 33 minutes.  Greater than 50% time was spent on coordination of care and face-to-face counseling.  POSSIBLE D/C IN 2 DAYS, DEPENDING ON CLINICAL CONDITION.   Shaune Pollack M.D on 11/16/2015 at 12:58 PM  Between 7am to 6pm - Pager - [phone]  After 6pm go to [email or web address] - password [email or web address] [facility]  [place] [facility]  Office  [phone]  CC: Primary care physician; [facility]

## 2015-11-17 DIAGNOSIS — J441 Chronic obstructive pulmonary disease with (acute) exacerbation: Secondary | ICD-10-CM | POA: Diagnosis not present

## 2015-11-17 DIAGNOSIS — R0902 Hypoxemia: Secondary | ICD-10-CM | POA: Diagnosis not present

## 2015-11-17 LAB — GLUCOSE, CAPILLARY
Glucose-Capillary: 57 mg/dL — ABNORMAL LOW (ref 65–99)
Glucose-Capillary: 73 mg/dL (ref 65–99)
Glucose-Capillary: 148 mg/dL — ABNORMAL HIGH (ref 65–99)
Glucose-Capillary: 138 mg/dL — ABNORMAL HIGH (ref 65–99)

## 2015-11-17 MED ORDER — PREDNISONE 10 MG PO TABS: ORAL_TABLET | ORAL | Status: DC

## 2015-11-17 NOTE — Discharge Summary (Signed)
[facility] [place] at [facility]   PATIENT NAME: Dana Holloway    MR#:  [id number]  DATE OF BIRTH:  September 01, 1934  DATE OF ADMISSION:  11/15/2015 ADMITTING PHYSICIAN: Ramonita Lab, MD  DATE OF DISCHARGE: 11/17/2015 12:22 PM  PRIMARY CARE PHYSICIAN: [facility]    ADMISSION DIAGNOSIS:  Hypoxia [R09.02] COPD exacerbation (HCC) [J44.1]   DISCHARGE DIAGNOSIS:   Acute respiratory failure with hypoxia secondary to COPD exacerbation SECONDARY DIAGNOSIS:   Past Medical History  Diagnosis Date  . Hypertension   . Coronary artery disease   . Diabetes mellitus without complication (HCC)   . Arthritis   . Orbital osteo-periostitis   . Asthma   . Gastritis     HOSPITAL COURSE:   1. Acute respiratory failure with hypoxia secondary to COPD exacerbation She was on oxygen via nasal cannula. She was treated with IV Solu-Medrol, changed to prednisone. She is also treated with DuoNeb and azithromycin.  Her symptoms has much improved and off oxygen.  2. Essential hypertension Blood pressure is stable. Continue home medications Cozaar and metoprolol and titrate as needed basis  3. History of diabetes mellitus-insulin-dependent Continue her home medications Lantus, she is on moderate sliding scale  4. History of coronary artery disease, status post stent -currently patient is asymptomatic Continue her home medications aspirin, Plavix, beta blocker and statin   DISCHARGE CONDITIONS:   Stable, discharged to home today.  CONSULTS OBTAINED:  Treatment Team:  Ramonita Lab, MD  DRUG ALLERGIES:   Allergies  Allergen Reactions  . Bactrim [Sulfamethoxazole-Trimethoprim] Hives  . Sulfa Antibiotics Hives    DISCHARGE MEDICATIONS:   Discharge Medication List as of 11/17/2015 11:23 AM    START taking these medications   Details  predniSONE (DELTASONE) 10 MG tablet 40 mg po daily for 2 days, 20 mg po daily for 2 days, 10 mg po daily  for 2 days., Print      CONTINUE these medications which have NOT CHANGED   Details  acetaminophen (TYLENOL) 325 MG tablet Take 650 mg by mouth 3 (three) times daily as needed for mild pain or moderate pain., Until Discontinued, Historical Med    albuterol (PROVENTIL HFA;VENTOLIN HFA) 108 (90 BASE) MCG/ACT inhaler Inhale 2 puffs into the lungs every 6 (six) hours as needed for wheezing or shortness of breath., Until Discontinued, Historical Med    aspirin EC 81 MG tablet Take 81 mg by mouth daily., Until Discontinued, Historical Med    atorvastatin (LIPITOR) 10 MG tablet Take 10 mg by mouth at bedtime., Until Discontinued, Historical Med    beclomethasone (QVAR) 80 MCG/ACT inhaler Inhale 2 puffs into the lungs 2 (two) times daily., Until Discontinued, Historical Med    cholecalciferol (VITAMIN D) 1000 UNITS tablet Take 1,000 Units by mouth daily., Until Discontinued, Historical Med    citalopram (CELEXA) 10 MG tablet Take 10 mg by mouth daily., Until Discontinued, Historical Med    clopidogrel (PLAVIX) 75 MG tablet Take 75 mg by mouth daily., Until Discontinued, Historical Med    diclofenac sodium (VOLTAREN) 1 % GEL Apply 1 application topically 4 (four) times daily., Until Discontinued, Historical Med    docusate sodium (COLACE) 100 MG capsule Take 2 mg by mouth 2 (two) times daily as needed for mild constipation., Until Discontinued, Historical Med    insulin glargine (LANTUS) 100 UNIT/ML injection Inject 35-42 Units into the skin at bedtime. 42 units in the morning and 35 units at night, Until Discontinued, Historical Med  losartan (COZAAR) 25 MG tablet Take 12.5 mg by mouth daily., Until Discontinued, Historical Med    metFORMIN (GLUCOPHAGE) 500 MG tablet Take 500 mg by mouth 2 (two) times daily with a meal., Until Discontinued, Historical Med    metoprolol succinate (TOPROL-XL) 25 MG 24 hr tablet Take 50 mg by mouth daily., Until Discontinued, Historical Med    miconazole  (MICOTIN) 2 % cream Apply 1 application topically 2 (two) times daily. Apply to feet, Until Discontinued, Historical Med    Multiple Vitamin (MULTIVITAMIN WITH MINERALS) TABS tablet Take 1 tablet by mouth at bedtime. , Until Discontinued, Historical Med    nitroGLYCERIN (NITROSTAT) 0.4 MG SL tablet Place 0.4 mg under the tongue every 5 (five) minutes as needed for chest pain., Until Discontinued, Historical Med    omeprazole (PRILOSEC) 40 MG capsule Take 40 mg by mouth daily., Until Discontinued, Historical Med    pregabalin (LYRICA) 50 MG capsule Take 50 mg by mouth 2 (two) times daily., Until Discontinued, Historical Med    QUEtiapine (SEROQUEL) 25 MG tablet Take 25-50 mg by mouth at bedtime., Until Discontinued, Historical Med         DISCHARGE INSTRUCTIONS:    If you experience worsening of your admission symptoms, develop shortness of breath, life threatening emergency, suicidal or homicidal thoughts you must seek medical attention immediately by calling 911 or calling your MD immediately  if symptoms less severe.  You Must read complete instructions/literature along with all the possible adverse reactions/side effects for all the Medicines you take and that have been prescribed to you. Take any new Medicines after you have completely understood and accept all the possible adverse reactions/side effects.   Please note  You were cared for by a hospitalist during your hospital stay. If you have any questions about your discharge medications or the care you received while you were in the hospital after you are discharged, you can call the unit and asked to speak with the hospitalist on call if the hospitalist that took care of you is not available. Once you are discharged, your primary care physician will handle any further medical issues. Please note that NO REFILLS for any discharge medications will be authorized once you are discharged, as it is imperative that you return to your  primary care physician (or establish a relationship with a primary care physician if you do not have one) for your aftercare needs so that they can reassess your need for medications and monitor your lab values.    Today   SUBJECTIVE   No shortness of breath.   VITAL SIGNS:  Blood pressure 104/43, pulse 94, temperature 98 F (36.7 C), temperature source Oral, resp. rate 18, height 5\' 3"  (1.6 m), weight 76.794 kg (169 lb 4.8 oz), SpO2 93 %.  I/O:   Intake/Output Summary (Last 24 hours) at 11/17/15 1531 Last data filed at 11/17/15 0800  Gross per 24 hour  Intake    480 ml  Output      0 ml  Net    480 ml    PHYSICAL EXAMINATION:  GENERAL:  82 y.o.-year-old patient lying in the bed with no acute distress.  EYES: Pupils equal, round, reactive to light and accommodation. No scleral icterus. Extraocular muscles intact.  HEENT: Head atraumatic, normocephalic. Oropharynx and nasopharynx clear.  NECK:  Supple, no jugular venous distention. No thyroid enlargement, no tenderness.  LUNGS: Normal breath sounds bilaterally, no wheezing, rales,rhonchi or crepitation. No use of accessory muscles of respiration.  CARDIOVASCULAR:  S1, S2 normal. No murmurs, rubs, or gallops.  ABDOMEN: Soft, non-tender, non-distended. Bowel sounds present. No organomegaly or mass.  EXTREMITIES: No pedal edema, cyanosis, or clubbing.  NEUROLOGIC: Cranial nerves II through XII are intact. Muscle strength 5/5 in all extremities. Sensation intact. Gait not checked.  PSYCHIATRIC: The patient is alert and oriented x 3.  SKIN: No obvious rash, lesion, or ulcer.   DATA REVIEW:   CBC  Recent Labs Lab 11/16/15 0438  WBC 14.7*  HGB 10.8*  HCT 32.9*  PLT 208    Chemistries   Recent Labs Lab 11/15/15 1857 11/16/15 0438  NA 137 136  K 3.8 3.9  CL 104 105  CO2 27 23  GLUCOSE 164* 331*  BUN 18 19  CREATININE 0.76 0.79  CALCIUM 8.7* 8.6*  AST 24  --   ALT 15  --   ALKPHOS 92  --   BILITOT 0.6  --      Cardiac Enzymes  Recent Labs Lab 11/15/15 1857  TROPONINI <0.03    Microbiology Results  Results for orders placed or performed during the hospital encounter of 11/15/15  Culture, sputum-assessment     Status: None   Collection Time: 11/16/15  4:12 AM  Result Value Ref Range Status   Specimen Description EXPECTORATED SPUTUM  Final   Special Requests NONE  Final   Sputum evaluation THIS SPECIMEN IS ACCEPTABLE FOR SPUTUM CULTURE  Final   Report Status 11/16/2015 FINAL  Final  Culture, respiratory (NON-Expectorated)     Status: None (Preliminary result)   Collection Time: 11/16/15  4:12 AM  Result Value Ref Range Status   Specimen Description EXPECTORATED SPUTUM  Final   Special Requests NONE Reflexed from [id number]  Final   Gram Stain   Final    MANY WBC SEEN MANY GRAM NEGATIVE RODS MODERATE GRAM POSITIVE COCCI FEW GRAM POSITIVE RODS    Culture Consistent with normal respiratory flora.  Final   Report Status PENDING  Incomplete    RADIOLOGY:  Dg Chest 2 View  11/15/2015  CLINICAL DATA:  Shortness of breath, hypoxia EXAM: CHEST  2 VIEW COMPARISON:  CT chest dated 08/24/2015 FINDINGS: Chronic interstitial markings. No focal consolidation. No pleural effusion or pneumothorax. Heart is top-normal in size. Degenerative changes of the visualized thoracolumbar spine. Moderate compression fracture deformity at T9, unchanged. IMPRESSION: No evidence of acute cardiopulmonary disease. Electronically Signed   By: Charline Bills M.D.   On: 11/15/2015 19:01       Translated by a Spanish interpreter. Management plans discussed with the patient, her daughter and they are in agreement.  CODE STATUS:  Code Status History    Date Active Date Inactive Code Status Order ID Comments User Context   11/15/2015 10:55 PM 11/17/2015  3:27 PM Full Code [id number]  Ramonita Lab, MD Inpatient   08/24/2015  6:15 AM 08/25/2015  4:11 PM Full Code [id number]  Arnaldo Natal, MD Inpatient       TOTAL TIME TAKING CARE OF THIS PATIENT: 37 minutes.    Shaune Pollack M.D on 11/17/2015 at 3:31 PM  Between 7am to 6pm - Pager - [phone]  After 6pm go to [email or web address] - password [email or web address] [facility]  [place] [facility]  Office  [phone]  CC: Primary care physician; [facility]

## 2015-11-17 NOTE — Progress Notes (Signed)
Pt with fsbs 57 this am orange juice given rechecked in 15 minutes came up to 73, pt encouraged to eat breakfast. Pt asymptomatic

## 2015-11-17 NOTE — Care Management Important Message (Signed)
Important Message  Patient Details  Name: Dana Holloway MRN: [id number] Date of Birth: Apr 02, 1934   Medicare Important Message Given:  Yes    [name] 11/17/2015, 9:43 AM

## 2015-11-17 NOTE — Discharge Instructions (Signed)
Heart healthy and ADA diet. °Activity as tolerated. °

## 2015-11-17 NOTE — Progress Notes (Signed)
O2 sat 88 on rm air

## 2015-11-17 NOTE — Progress Notes (Signed)
Pt and daughter given d/c instructions with interpreter present, prescription x 1 given to pt, voiced understanding, pt d/c home via wheelchair escorted by staff and family

## 2015-11-17 NOTE — Care Management (Signed)
Admitted to [facility] with the diagnosis of COPD. Lives with daughter, Raynelle Jan [phone]). Last seen a primary care physician at [facility] in December. No home oxygen. Home Health in 2014. Doesn't remember name of agency. Skilled Nursing November 2014. Doesn't remember name of facility. Uses a cane to aid in ambulation. Appetite "not so good." Dizzy and fall in January. Takes care of all basic activities of daily living herself. Daughter helps with errands. Family will transport. Gwenette Greet RN MSN CCM Care Management [phone]

## 2015-11-17 NOTE — Progress Notes (Signed)
O2 Challenge: 94% on O2  LPM rest 94% on O2@ 1 LPM exertion 90% on room air with exertion 91% on room air at rest

## 2015-12-17 LAB — CULTURE, RESPIRATORY W GRAM STAIN

## 2015-12-17 LAB — CULTURE, RESPIRATORY

## 2015-12-26 ENCOUNTER — Emergency Department
Admission: EM | Admit: 2015-12-26 | Discharge: 2015-12-26 | Disposition: A | Discharge status: Home / Self Care | Payer: Medicare Other | Attending: Student | Admitting: Student

## 2015-12-26 ENCOUNTER — Emergency Department: Payer: Medicare Other

## 2015-12-26 ENCOUNTER — Encounter: Payer: Self-pay | Admitting: Emergency Medicine

## 2015-12-26 DIAGNOSIS — R112 Nausea with vomiting, unspecified: Secondary | ICD-10-CM | POA: Insufficient documentation

## 2015-12-26 DIAGNOSIS — R1111 Vomiting without nausea: Secondary | ICD-10-CM

## 2015-12-26 DIAGNOSIS — Z7902 Long term (current) use of antithrombotics/antiplatelets: Secondary | ICD-10-CM | POA: Insufficient documentation

## 2015-12-26 DIAGNOSIS — I10 Essential (primary) hypertension: Secondary | ICD-10-CM | POA: Diagnosis not present

## 2015-12-26 DIAGNOSIS — Z7951 Long term (current) use of inhaled steroids: Secondary | ICD-10-CM | POA: Diagnosis not present

## 2015-12-26 DIAGNOSIS — J189 Pneumonia, unspecified organism: Secondary | ICD-10-CM | POA: Diagnosis not present

## 2015-12-26 DIAGNOSIS — Z7984 Long term (current) use of oral hypoglycemic drugs: Secondary | ICD-10-CM | POA: Insufficient documentation

## 2015-12-26 DIAGNOSIS — E119 Type 2 diabetes mellitus without complications: Secondary | ICD-10-CM | POA: Diagnosis not present

## 2015-12-26 DIAGNOSIS — Z794 Long term (current) use of insulin: Secondary | ICD-10-CM | POA: Diagnosis not present

## 2015-12-26 DIAGNOSIS — J441 Chronic obstructive pulmonary disease with (acute) exacerbation: Secondary | ICD-10-CM | POA: Diagnosis not present

## 2015-12-26 DIAGNOSIS — Z79899 Other long term (current) drug therapy: Secondary | ICD-10-CM | POA: Diagnosis not present

## 2015-12-26 DIAGNOSIS — Z7982 Long term (current) use of aspirin: Secondary | ICD-10-CM | POA: Insufficient documentation

## 2015-12-26 DIAGNOSIS — R300 Dysuria: Secondary | ICD-10-CM | POA: Insufficient documentation

## 2015-12-26 DIAGNOSIS — R109 Unspecified abdominal pain: Secondary | ICD-10-CM | POA: Diagnosis not present

## 2015-12-26 DIAGNOSIS — R51 Headache: Secondary | ICD-10-CM | POA: Diagnosis present

## 2015-12-26 DIAGNOSIS — Z791 Long term (current) use of non-steroidal anti-inflammatories (NSAID): Secondary | ICD-10-CM | POA: Insufficient documentation

## 2015-12-26 DIAGNOSIS — M549 Dorsalgia, unspecified: Secondary | ICD-10-CM | POA: Diagnosis not present

## 2015-12-26 LAB — COMPREHENSIVE METABOLIC PANEL
ALK PHOS: 89 U/L (ref 38–126)
ALT: 17 U/L (ref 14–54)
AST: 23 U/L (ref 15–41)
Albumin: 3.7 g/dL (ref 3.5–5.0)
Anion gap: 6 (ref 5–15)
BILIRUBIN TOTAL: 0.7 mg/dL (ref 0.3–1.2)
BUN: 13 mg/dL (ref 6–20)
CO2: 24 mmol/L (ref 22–32)
CREATININE: 0.59 mg/dL (ref 0.44–1.00)
Calcium: 8.4 mg/dL — ABNORMAL LOW (ref 8.9–10.3)
Chloride: 101 mmol/L (ref 101–111)
GFR calc non Af Amer: >60 mL/min (ref >60)
Glucose, Bld: 158 mg/dL — ABNORMAL HIGH (ref 65–99)
Potassium: 4 mmol/L (ref 3.5–5.1)
SODIUM: 131 mmol/L — AB (ref 135–145)
Total Protein: 6.6 g/dL (ref 6.5–8.1)

## 2015-12-26 LAB — TROPONIN I

## 2015-12-26 LAB — URINALYSIS COMPLETE WITH MICROSCOPIC (ARMC ONLY)
Bacteria, UA: NONE SEEN
Bilirubin Urine: NEGATIVE
Glucose, UA: NEGATIVE mg/dL
HGB URINE DIPSTICK: NEGATIVE
KETONES UR: NEGATIVE mg/dL
LEUKOCYTES UA: NEGATIVE
NITRITE: NEGATIVE
PH: 7 (ref 5.0–8.0)
PROTEIN: NEGATIVE mg/dL
RBC / HPF: NONE SEEN RBC/hpf (ref 0–5)
SPECIFIC GRAVITY, URINE: 1.005 (ref 1.005–1.030)
SQUAMOUS EPITHELIAL / LPF: NONE SEEN

## 2015-12-26 LAB — LIPASE, BLOOD: Lipase: 16 U/L (ref 11–51)

## 2015-12-26 LAB — RAPID INFLUENZA A&B ANTIGENS
Influenza A (ARMC): NEGATIVE
Influenza B (ARMC): NEGATIVE

## 2015-12-26 LAB — CBC WITH DIFFERENTIAL/PLATELET
Basophils Absolute: 0.1 10*3/uL (ref 0–0.1)
Basophils Relative: 1 %
EOS ABS: 0.2 10*3/uL (ref 0–0.7)
Eosinophils Relative: 2 %
HEMATOCRIT: 35.5 % (ref 35.0–47.0)
HEMOGLOBIN: 11.8 g/dL — AB (ref 12.0–16.0)
LYMPHS ABS: 1.4 10*3/uL (ref 1.0–3.6)
LYMPHS PCT: 14 %
MCH: 26.1 pg (ref 26.0–34.0)
MCHC: 33.3 g/dL (ref 32.0–36.0)
MCV: 78.5 fL — AB (ref 80.0–100.0)
Monocytes Absolute: 0.6 10*3/uL (ref 0.2–0.9)
Monocytes Relative: 6 %
NEUTROS ABS: 7.9 10*3/uL — AB (ref 1.4–6.5)
NEUTROS PCT: 77 %
Platelets: 222 10*3/uL (ref 150–440)
RBC: 4.52 MIL/uL (ref 3.80–5.20)
RDW: 13.7 % (ref 11.5–14.5)
WBC: 10.1 10*3/uL (ref 3.6–11.0)

## 2015-12-26 MED ORDER — ONDANSETRON HCL 4 MG/2ML IJ SOLN
4.0000 mg | Freq: Once | INTRAMUSCULAR | Status: AC
  Administered 2015-12-26: 4 mg via INTRAVENOUS
  Filled 2015-12-26: qty 2

## 2015-12-26 MED ORDER — ONDANSETRON HCL 4 MG/2ML IJ SOLN
INTRAMUSCULAR | Status: AC
  Administered 2015-12-26: 4 mg via INTRAVENOUS
  Filled 2015-12-26: qty 2

## 2015-12-26 MED ORDER — PROMETHAZINE HCL 12.5 MG PO TABS: 12.5000 mg | ORAL_TABLET | Freq: Three times a day (TID) | ORAL | Status: DC | PRN

## 2015-12-26 MED ORDER — OXYCODONE HCL 5 MG PO TABS
5.0000 mg | ORAL_TABLET | Freq: Once | ORAL | Status: AC
  Administered 2015-12-26: 5 mg via ORAL
  Filled 2015-12-26: qty 1

## 2015-12-26 MED ORDER — AZITHROMYCIN 250 MG PO TABS: ORAL_TABLET | ORAL | Status: AC

## 2015-12-26 MED ORDER — ONDANSETRON HCL 4 MG/2ML IJ SOLN
4.0000 mg | Freq: Once | INTRAMUSCULAR | Status: AC
  Administered 2015-12-26: 4 mg via INTRAVENOUS

## 2015-12-26 MED ORDER — AZITHROMYCIN 500 MG PO TABS
500.0000 mg | ORAL_TABLET | Freq: Once | ORAL | Status: AC
  Administered 2015-12-26: 500 mg via ORAL
  Filled 2015-12-26: qty 1

## 2015-12-26 MED ORDER — IOPAMIDOL (ISOVUE-300) INJECTION 61%
100.0000 mL | Freq: Once | INTRAVENOUS | Status: AC | PRN
  Administered 2015-12-26: 100 mL via INTRAVENOUS

## 2015-12-26 MED ORDER — SODIUM CHLORIDE 0.9 % IV BOLUS (SEPSIS)
500.0000 mL | Freq: Once | INTRAVENOUS | Status: AC
  Administered 2015-12-26: 500 mL via INTRAVENOUS

## 2015-12-26 MED ORDER — PROMETHAZINE HCL 25 MG/ML IJ SOLN
12.5000 mg | Freq: Once | INTRAMUSCULAR | Status: AC
  Administered 2015-12-26: 12.5 mg via INTRAVENOUS
  Filled 2015-12-26: qty 1

## 2015-12-26 MED ORDER — ACETAMINOPHEN 500 MG PO TABS
1000.0000 mg | ORAL_TABLET | Freq: Once | ORAL | Status: AC
  Administered 2015-12-26: 1000 mg via ORAL
  Filled 2015-12-26: qty 2

## 2015-12-26 MED ORDER — DIATRIZOATE MEGLUMINE & SODIUM 66-10 % PO SOLN
15.0000 mL | Freq: Once | ORAL | Status: AC
  Administered 2015-12-26: 15 mL via ORAL

## 2015-12-26 NOTE — ED Notes (Signed)
Patient transported to CT 

## 2015-12-26 NOTE — ED Notes (Signed)
Pt and family verbalized understanding of discharge instructions to interpretor. NAD at this time.

## 2015-12-26 NOTE — Discharge Instructions (Signed)
Please take your antibiotics as prescribed. Please take nausea medication as needed, as written. Return to the emergency department for any worsening symptoms, or if you are unable to keep down her antibiotics due to nausea. Please follow-up with her primary care physician in one to 2 days for recheck/reevaluation.   Neumona extrahospitalaria en los adultos (Community-Acquired Pneumonia, Adult) La neumona es una infeccin en los pulmones. La neumona puede contagiarse mientras una persona est hospitalizada. Cuando una persona no est en el hospital, puede tener un tipo diferente de la enfermedad (neumona extrahospitalaria), la cual se transmite fcilmente de Neomia Dearuna persona a Educational psychologistotra. Una persona puede contraer neumona extrahospitalaria si respira cerca de un individuo infectado que tose o estornuda. Algunos sntomas incluyen lo siguiente:  Tos seca.  Tos con expectoracin (productiva).  Grant RutsFiebre.  Sudoracin.  Dolor en el pecho.  CUIDADOS EN EL HOGAR  Tome los medicamentos de venta libre y los recetados solamente como se lo haya indicado el mdico.  Tome los medicamentos para la tos solamente si no puede dormir bien.  Si le recetaron un antibitico, tmelo como se lo haya indicado el mdico. No deje de tomar los antibiticos aunque comience a Actorsentirse mejor.  Duerma con la cabeza y el cuello elevados. Para lograrlo, colquese algunas almohadas debajo de la cabeza, o bien puede dormir en un silln reclinable.  No consuma productos que contengan tabaco. Estos incluyen cigarrillos, tabaco para mascar y Administrator, Civil Servicecigarrillos electrnicos. Si necesita ayuda para dejar de fumar, consulte al mdico.  Beba suficiente agua para mantener la orina clara o de color amarillo plido. Una vacuna puede ayudar a prevenir la neumona. Habitualmente, la vacuna se recomienda a:  Las Smith Internationalpersonas mayores de [id number].  Las Smith Internationalpersonas mayores de 19aos:  Las personas que estn recibiendo tratamiento para Management consultantel cncer.  Las  personas que tienen una enfermedad pulmonar a largo plazo (crnica).  Las Eli Lilly and Companypersonas que tienen problemas del sistema de defensa del organismo (sistema inmunitario). Tambin puede evitar la neumona si toma las siguientes medidas:  Se aplica la vacuna antigripal todos los [place].  Visita al dentista con la frecuencia indicada.  Se lava las manos a menudo. Utilice un desinfectante para manos si no dispone de [place] y [place]n. SOLICITE AYUDA SI:  Tiene fiebre.  No puede dormir bien porque el medicamento para la tos no resulta eficaz. SOLICITE AYUDA DE INMEDIATO SI:  Le falta el aire y este sntoma Mexicoempeora.  Aumenta el dolor en el pecho.  La enfermedad empeora. Esto es muy grave si:  Es un adulto mayor.  El sistema de defensa del organismo est debilitado.  Tose y escupe sangre.   Esta informacin no tiene Theme park managercomo fin reemplazar el consejo del mdico. Asegrese de hacerle al mdico cualquier pregunta que tenga.   Document Released: 03/10/2010 Document Revised: 06/11/2015 Elsevier Interactive Patient Education [employer]2016 Elsevier Inc.

## 2015-12-26 NOTE — ED Notes (Signed)
Pt in with co vomiting states bp at 160 systolic at home.  Denies any abd pain but does have painful urination.

## 2015-12-26 NOTE — ED Provider Notes (Signed)
-----------------------------------------   8:27 AM on 12/26/2015 -----------------------------------------  Patient's workup showed largely normal results besides a possible atypical infection on chest x-ray. We will cover with Zithromax. Patient continues to feel mild nausea at this time, we will dose Phenergan in the emergency department. We will discharge with Phenergan and Zithromax. I have personally seen the patient, explained the findings with the use of a hospital interpreter. We will discharge the patient home with primary care follow-up if not improved within 1-2 days. Patient and daughter agreeable to this plan.  Minna AntisKevin Elmarie Devlin, MD 12/26/15 [phone]

## 2015-12-26 NOTE — ED Provider Notes (Addendum)
[facility] Emergency Department Provider Note  ____________________________________________  Time seen: Approximately 4:07 AM  I have reviewed the triage vital signs and the nursing notes.   HISTORY  Chief Complaint Headache    HPI Dana Holloway is a 82 y.o. female with history of coronary artery disease status post stent, diabetes, hypertension, COPD, gastritis and arthritis who presents for evaluation of nausea and vomiting yesterday evening, gradual onset, constant since onset, moderate, no modifying factors. The patient reports that for the past 3 days she has had nasal congestion/runny nose and cough. Tonight she developed  epigastric abdominal pain and had multiple episodes of nonbloody nonbilious emesis. She also developed headache and backache. She is having dysuria. She denies any chest pain. She reports some mild shortness of breath which is chronic and unchanged from baseline. No known sick contacts. She does not think she has had fever.     Past Medical History  Diagnosis Date  . Hypertension   . Coronary artery disease   . Diabetes mellitus without complication (HCC)   . Arthritis   . Orbital osteo-periostitis   . Asthma   . Gastritis     Patient Active Problem List   Diagnosis Date Noted  . COPD with acute exacerbation (HCC) 11/15/2015  . Hypoxia 08/24/2015    Past Surgical History  Procedure Laterality Date  . Cardiac surgery      cardiac cath with stent 2014  . Cholecystectomy    . Percutaneous coronary stent intervention (pci-s)      Current Outpatient Rx  Name  Route  Sig  Dispense  Refill  . acetaminophen (TYLENOL) 325 MG tablet   Oral   Take 650 mg by mouth 3 (three) times daily as needed for mild pain or moderate pain.         Marland Kitchen albuterol (PROVENTIL HFA;VENTOLIN HFA) 108 (90 BASE) MCG/ACT inhaler   Inhalation   Inhale 2 puffs into the lungs every 6 (six) hours as needed for wheezing or shortness of  breath.         Marland Kitchen aspirin EC 81 MG tablet   Oral   Take 81 mg by mouth daily.         Marland Kitchen atorvastatin (LIPITOR) 10 MG tablet   Oral   Take 10 mg by mouth at bedtime.         . beclomethasone (QVAR) 80 MCG/ACT inhaler   Inhalation   Inhale 2 puffs into the lungs 2 (two) times daily.         . cholecalciferol (VITAMIN D) 1000 UNITS tablet   Oral   Take 1,000 Units by mouth daily.         . citalopram (CELEXA) 10 MG tablet   Oral   Take 10 mg by mouth daily.         . clopidogrel (PLAVIX) 75 MG tablet   Oral   Take 75 mg by mouth daily.         . diclofenac sodium (VOLTAREN) 1 % GEL   Topical   Apply 1 application topically 4 (four) times daily.         Marland Kitchen docusate sodium (COLACE) 100 MG capsule   Oral   Take 2 mg by mouth 2 (two) times daily as needed for mild constipation.         . insulin glargine (LANTUS) 100 UNIT/ML injection   Subcutaneous   Inject 35-42 Units into the skin at bedtime. 42 units in the morning  and 35 units at night         . losartan (COZAAR) 25 MG tablet   Oral   Take 12.5 mg by mouth daily.         . metFORMIN (GLUCOPHAGE) 500 MG tablet   Oral   Take 500 mg by mouth 2 (two) times daily with a meal.         . metoprolol succinate (TOPROL-XL) 25 MG 24 hr tablet   Oral   Take 50 mg by mouth daily.         . miconazole (MICOTIN) 2 % cream   Topical   Apply 1 application topically 2 (two) times daily. Apply to feet         . Multiple Vitamin (MULTIVITAMIN WITH MINERALS) TABS tablet   Oral   Take 1 tablet by mouth at bedtime.          . nitroGLYCERIN (NITROSTAT) 0.4 MG SL tablet   Sublingual   Place 0.4 mg under the tongue every 5 (five) minutes as needed for chest pain.         Marland Kitchen. omeprazole (PRILOSEC) 40 MG capsule   Oral   Take 40 mg by mouth daily.         . predniSONE (DELTASONE) 10 MG tablet      40 mg po daily for 2 days, 20 mg po daily for 2 days, 10 mg po daily for 2 days.   14 tablet   0    . pregabalin (LYRICA) 50 MG capsule   Oral   Take 50 mg by mouth 2 (two) times daily.         . QUEtiapine (SEROQUEL) 25 MG tablet   Oral   Take 25-50 mg by mouth at bedtime.           Allergies Bactrim and Sulfa antibiotics  Family History  Problem Relation Age of Onset  . Diabetes Mellitus II Other     Social History Social History  Substance Use Topics  . Smoking status: Never Smoker   . Smokeless tobacco: Never Used  . Alcohol Use: No    Review of Systems Constitutional: No fever/chills Eyes: No visual changes. ENT: No sore throat. Cardiovascular: Denies chest pain. Respiratory: +shortness of breath. Gastrointestinal: + abdominal pain.  + nausea, = vomiting.  No diarrhea.  No constipation. Genitourinary: Positive for dysuria. Musculoskeletal: Positive for back pain. Skin: Negative for rash. Neurological: Positive for headache, no focal weakness or numbness.  10-point ROS otherwise negative.  ____________________________________________   PHYSICAL EXAM:  VITAL SIGNS: ED Triage Vitals  Enc Vitals Group     BP 12/26/15 0356 141/62 mmHg     Pulse Rate 12/26/15 0356 65     Resp 12/26/15 0356 18     Temp 12/26/15 0356 97.7 F (36.5 C)     Temp Source 12/26/15 0356 Oral     SpO2 12/26/15 0356 94 %     Weight 12/26/15 0358 170 lb (77.111 kg)     Height 12/26/15 0358 5\' 2"  (1.575 m)     Head Cir --      Peak Flow --      Pain Score 12/26/15 0358 4     Pain Loc --      Pain Edu? --      Excl. in GC? --     Constitutional: Alert and oriented. Nontoxic appearing and in no acute distress. Eyes: Conjunctivae are normal. PERRL. EOMI. Head: Atraumatic. Nose: + congestion/rhinnorhea. Mouth/Throat:  Mucous membranes are moist.  Oropharynx non-erythematous. Neck: No stridor.  Supple without meningismus. Cardiovascular: Normal rate, regular rhythm. Grossly normal heart sounds.  Good peripheral circulation. Respiratory: Normal respiratory effort.  No  retractions. Lungs CTAB. Gastrointestinal: Soft and nontender. No distention. No CVA tenderness. Genitourinary: deferred Musculoskeletal: No lower extremity tenderness nor edema.  No joint effusions. Neurologic:  Normal speech and language. No gross focal neurologic deficits are appreciated. 5 out of 5 strength in bilateral upper and lower extremities, sensation intact to light touch throughout. Skin:  Skin is warm, dry and intact. No rash noted. Psychiatric: Mood and affect are normal. Speech and behavior are normal.  ____________________________________________   LABS (all labs ordered are listed, but only abnormal results are displayed)  Labs Reviewed  CBC WITH DIFFERENTIAL/PLATELET - Abnormal; Notable for the following:    Hemoglobin 11.8 (*)    MCV 78.5 (*)    Neutro Abs 7.9 (*)    All other components within normal limits  COMPREHENSIVE METABOLIC PANEL - Abnormal; Notable for the following:    Sodium 131 (*)    Glucose, Bld 158 (*)    Calcium 8.4 (*)    All other components within normal limits  RAPID INFLUENZA A&B ANTIGENS ([facility] ONLY)  LIPASE, BLOOD  TROPONIN I  URINALYSIS COMPLETEWITH MICROSCOPIC ([facility] ONLY)   ____________________________________________  EKG  ED ECG REPORT I, Gayla Doss, the attending physician, personally viewed and interpreted this ECG.   Date: 12/26/2015  EKG Time: 04:39  Rate: 63  Rhythm: normal sinus rhythm  Axis: normal  Intervals:none  ST&T Change: No acute ST elevation.  ____________________________________________  RADIOLOGY  CXR IMPRESSION: Diffusely increased interstitial prominence throughout the lungs bilaterally. Interstitial edema is favored, although possible atypical infection could also have this appearance.  CT abdomen and pelvis - pending ____________________________________________   PROCEDURES  Procedure(s) performed: None  Critical Care performed:  No  ____________________________________________   INITIAL IMPRESSION / ASSESSMENT AND PLAN / ED COURSE  Pertinent labs & imaging results that were available during my care of the patient were reviewed by me and considered in my medical decision making (see chart for details).  Dana Holloway is a 82 y.o. female with history of coronary artery disease status post stent, diabetes, hypertension, COPD, gastritis and arthritis who presents for evaluation of nausea and vomiting, headache and myalgias in the setting of several days of cough and runny nose. On exam, she is nontoxic appearing and in no acute distress. Vital signs are stable, she is afebrile. Suspect that her symptoms may be related to viral syndrome. We'll obtain screening labs, chest x-ray, flu swab, urinalysis and treat her symptomatically. Reassess for disposition and need for advanced imaging.  ----------------------------------------- 7:13 AM on 12/26/2015 ----------------------------------------- Patient with significant improvement of her headache and abdominal pain. She still feels nauseated but she is tolerated all of her oral contrast for CT scan which is pending. Her analysis is also pending. Flu negative. CBC with mild anemia, hemoglobin 11.8. CMP with mild hyponatremia, sodium 131. Normal lipase, negative troponin. Chest x-ray read as edema versus atypical infection. The patient does not appear grossly volume overloaded so suspect this may represent atypical pneumonia. We'll treat with Azithromycin. Care transferred to Dr. Lenard Lance at this time pending reassessment, results of urinalysis, CT scan and final disposition.  ____________________________________________   FINAL CLINICAL IMPRESSION(S) / ED DIAGNOSES  Final diagnoses:  Atypical pneumonia  Non-intractable vomiting without nausea, vomiting of unspecified type      Gayla Doss,  MD 12/26/15 2130  Gayla Doss, MD 12/26/15 [phone]

## 2016-01-12 ENCOUNTER — Emergency Department: Payer: Medicare Other

## 2016-01-12 ENCOUNTER — Emergency Department
Admission: EM | Admit: 2016-01-12 | Discharge: 2016-01-12 | Disposition: A | Discharge status: Home / Self Care | Payer: Medicare Other | Attending: Emergency Medicine | Admitting: Emergency Medicine

## 2016-01-12 DIAGNOSIS — Z7982 Long term (current) use of aspirin: Secondary | ICD-10-CM | POA: Insufficient documentation

## 2016-01-12 DIAGNOSIS — M8468XS Pathological fracture in other disease, other site, sequela: Secondary | ICD-10-CM | POA: Insufficient documentation

## 2016-01-12 DIAGNOSIS — I10 Essential (primary) hypertension: Secondary | ICD-10-CM | POA: Insufficient documentation

## 2016-01-12 DIAGNOSIS — E119 Type 2 diabetes mellitus without complications: Secondary | ICD-10-CM | POA: Insufficient documentation

## 2016-01-12 DIAGNOSIS — Z794 Long term (current) use of insulin: Secondary | ICD-10-CM | POA: Diagnosis not present

## 2016-01-12 DIAGNOSIS — Z79899 Other long term (current) drug therapy: Secondary | ICD-10-CM | POA: Diagnosis not present

## 2016-01-12 DIAGNOSIS — R110 Nausea: Secondary | ICD-10-CM | POA: Diagnosis not present

## 2016-01-12 DIAGNOSIS — R350 Frequency of micturition: Secondary | ICD-10-CM | POA: Insufficient documentation

## 2016-01-12 DIAGNOSIS — M1990 Unspecified osteoarthritis, unspecified site: Secondary | ICD-10-CM | POA: Insufficient documentation

## 2016-01-12 DIAGNOSIS — J45909 Unspecified asthma, uncomplicated: Secondary | ICD-10-CM | POA: Diagnosis not present

## 2016-01-12 DIAGNOSIS — R109 Unspecified abdominal pain: Secondary | ICD-10-CM | POA: Diagnosis not present

## 2016-01-12 DIAGNOSIS — R300 Dysuria: Secondary | ICD-10-CM | POA: Diagnosis not present

## 2016-01-12 DIAGNOSIS — M4854XS Collapsed vertebra, not elsewhere classified, thoracic region, sequela of fracture: Secondary | ICD-10-CM

## 2016-01-12 LAB — CBC WITH DIFFERENTIAL/PLATELET
Basophils Absolute: 0 10*3/uL (ref 0–0.1)
Basophils Relative: 0 %
EOS ABS: 0.2 10*3/uL (ref 0–0.7)
Eosinophils Relative: 3 %
HCT: 36.4 % (ref 35.0–47.0)
HEMOGLOBIN: 12.2 g/dL (ref 12.0–16.0)
LYMPHS ABS: 1.8 10*3/uL (ref 1.0–3.6)
LYMPHS PCT: 21 %
MCH: 26.3 pg (ref 26.0–34.0)
MCHC: 33.4 g/dL (ref 32.0–36.0)
MCV: 78.8 fL — AB (ref 80.0–100.0)
Monocytes Absolute: 0.6 10*3/uL (ref 0.2–0.9)
Monocytes Relative: 7 %
NEUTROS PCT: 69 %
Neutro Abs: 5.9 10*3/uL (ref 1.4–6.5)
Platelets: 248 10*3/uL (ref 150–440)
RBC: 4.62 MIL/uL (ref 3.80–5.20)
RDW: 13.5 % (ref 11.5–14.5)
WBC: 8.5 10*3/uL (ref 3.6–11.0)

## 2016-01-12 LAB — URINALYSIS COMPLETE WITH MICROSCOPIC (ARMC ONLY)
BACTERIA UA: NONE SEEN
Bilirubin Urine: NEGATIVE
GLUCOSE, UA: NEGATIVE mg/dL
Hgb urine dipstick: NEGATIVE
Ketones, ur: NEGATIVE mg/dL
Leukocytes, UA: NEGATIVE
Nitrite: NEGATIVE
Protein, ur: NEGATIVE mg/dL
RBC / HPF: NONE SEEN RBC/hpf (ref 0–5)
Specific Gravity, Urine: 1.011 (ref 1.005–1.030)
pH: 7 (ref 5.0–8.0)

## 2016-01-12 LAB — BASIC METABOLIC PANEL
Anion gap: 5 (ref 5–15)
BUN: 20 mg/dL (ref 6–20)
CHLORIDE: 104 mmol/L (ref 101–111)
CO2: 28 mmol/L (ref 22–32)
Calcium: 9.1 mg/dL (ref 8.9–10.3)
Creatinine, Ser: 0.73 mg/dL (ref 0.44–1.00)
GFR calc Af Amer: >60 mL/min (ref >60)
GFR calc non Af Amer: >60 mL/min (ref >60)
Glucose, Bld: 126 mg/dL — ABNORMAL HIGH (ref 65–99)
POTASSIUM: 4 mmol/L (ref 3.5–5.1)
SODIUM: 137 mmol/L (ref 135–145)

## 2016-01-12 MED ORDER — TOLTERODINE TARTRATE ER 4 MG PO CP24
4.0000 mg | ORAL_CAPSULE | Freq: Every day | ORAL | Status: AC
Start: 2016-01-12 — End: 2017-01-11

## 2016-01-12 MED ORDER — OXYCODONE-ACETAMINOPHEN 5-325 MG PO TABS
1.0000 | ORAL_TABLET | Freq: Once | ORAL | Status: AC
  Administered 2016-01-12: 1 via ORAL

## 2016-01-12 MED ORDER — ONDANSETRON 4 MG PO TBDP
ORAL_TABLET | ORAL | Status: AC
  Administered 2016-01-12: 4 mg via ORAL
  Filled 2016-01-12: qty 1

## 2016-01-12 MED ORDER — ONDANSETRON HCL 4 MG PO TABS: 4.0000 mg | ORAL_TABLET | Freq: Once | ORAL | Status: DC

## 2016-01-12 MED ORDER — OXYCODONE-ACETAMINOPHEN 5-325 MG PO TABS
ORAL_TABLET | ORAL | Status: AC
  Administered 2016-01-12: 1 via ORAL
  Filled 2016-01-12: qty 1

## 2016-01-12 MED ORDER — ONDANSETRON 4 MG PO TBDP
4.0000 mg | ORAL_TABLET | Freq: Once | ORAL | Status: AC
  Administered 2016-01-12: 4 mg via ORAL

## 2016-01-12 MED ORDER — TRAMADOL HCL 50 MG PO TABS: 50.0000 mg | ORAL_TABLET | Freq: Four times a day (QID) | ORAL | Status: DC | PRN

## 2016-01-12 NOTE — ED Provider Notes (Signed)
[facility] Emergency Department Provider Note     Time seen: ----------------------------------------- 3:30 PM on 01/12/2016 -----------------------------------------    I have reviewed the triage vital signs and the nursing notes.   HISTORY  Chief Complaint Urinary Frequency and Flank Pain    HPI Dana Holloway is a 82 y.o. female presents to ER with burning with urination and bilateral flank pain for the last week with nausea. She denies vomiting or fever, states she has had chills with the symptoms. Nothing is made her symptoms better, also has burning along her entire spine.   Past Medical History  Diagnosis Date  . Hypertension   . Coronary artery disease   . Diabetes mellitus without complication (HCC)   . Arthritis   . Orbital osteo-periostitis   . Asthma   . Gastritis     Patient Active Problem List   Diagnosis Date Noted  . COPD with acute exacerbation (HCC) 11/15/2015  . Hypoxia 08/24/2015    Past Surgical History  Procedure Laterality Date  . Cardiac surgery      cardiac cath with stent 2014  . Cholecystectomy    . Percutaneous coronary stent intervention (pci-s)      Allergies Bactrim and Sulfa antibiotics  Social History Social History  Substance Use Topics  . Smoking status: Never Smoker   . Smokeless tobacco: Never Used  . Alcohol Use: No    Review of Systems Constitutional: Negative for fever. Positive for chills Eyes: Negative for visual changes. ENT: Negative for sore throat. Cardiovascular: Negative for chest pain. Respiratory: Negative for shortness of breath. Gastrointestinal: Negative for abdominal pain, vomiting and diarrhea. Genitourinary: Positive for dysuria Musculoskeletal: Positive for back pain Skin: Negative for rash. Neurological: Negative for headaches, focal weakness or numbness.  10-point ROS otherwise negative.  ____________________________________________   PHYSICAL  EXAM:  VITAL SIGNS: ED Triage Vitals  Enc Vitals Group     BP 01/12/16 1411 113/56 mmHg     Pulse Rate 01/12/16 1411 71     Resp 01/12/16 1411 18     Temp 01/12/16 1411 98.3 F (36.8 C)     Temp Source 01/12/16 1411 Oral     SpO2 01/12/16 1411 96 %     Weight 01/12/16 1411 175 lb (79.379 kg)     Height 01/12/16 1411  (1.549 m)     Head Cir --      Peak Flow --      Pain Score 01/12/16 1412 5     Pain Loc --      Pain Edu? --      Excl. in GC? --    Constitutional: Alert and oriented. Well appearing and in no distress. Eyes: Conjunctivae are normal. PERRL. Normal extraocular movements. ENT   Head: Normocephalic and atraumatic.   Nose: No congestion/rhinnorhea.   Mouth/Throat: Mucous membranes are moist.   Neck: No stridor. Cardiovascular: Normal rate, regular rhythm. No murmurs, rubs, or gallops. Respiratory: Normal respiratory effort without tachypnea nor retractions. Breath sounds are clear and equal bilaterally. No wheezes/rales/rhonchi. Gastrointestinal: Soft and nontender. Normal bowel sounds Musculoskeletal: Nontender with normal range of motion in all extremities. No lower extremity tenderness nor edema. Neurologic:  Normal speech and language. No gross focal neurologic deficits are appreciated.  Skin:  Skin is warm, dry and intact. No rash noted. Psychiatric: Mood and affect are normal. Speech and behavior are normal.  ____________________________________________  ED COURSE:  Pertinent labs & imaging results that were available during my care  of the patient were reviewed by me and considered in my medical decision making (see chart for details). Patient with nonspecific symptoms, I will check basic labs, urine and reevaluate. ____________________________________________    LABS (pertinent positives/negatives)  Labs Reviewed  URINALYSIS COMPLETEWITH MICROSCOPIC ([facility] ONLY) - Abnormal; Notable for the following:    Color, Urine YELLOW (*)     APPearance CLEAR (*)    Squamous Epithelial / LPF 0-5 (*)    All other components within normal limits  CBC WITH DIFFERENTIAL/PLATELET - Abnormal; Notable for the following:    MCV 78.8 (*)    All other components within normal limits  BASIC METABOLIC PANEL - Abnormal; Notable for the following:    Glucose, Bld 126 (*)    All other components within normal limits    RADIOLOGY Images were viewed by me  CT renal protocol IMPRESSION:  1. No urolithiasis. No evidence of urinary tract obstruction. No  noncontrast CT evidence of acute urinary tract infection.  2. No acute abnormality. No evidence of bowel obstruction or acute  bowel inflammation.  3. Stable chronic T9 vertebral compression fracture and diffuse  osteopenia.   ____________________________________________  FINAL ASSESSMENT AND PLAN  Flank pain, dysuria  Plan: Patient with labs and imaging as dictated above. CT and labs as dictated above. Her lab work is unremarkable, CT shows a chronic T9 compression fracture.This is the only etiology for her pain that I can identify. I will prescribe pain medicine for her back and orthopedics referral.   Emily FilbertWilliams, Rodell Marrs E, MD   Emily FilbertJonathan E Chaden Doom, MD 01/12/16 [phone]

## 2016-01-12 NOTE — ED Notes (Signed)
Pt c/o burning with urination and BL flank pain for the past week with nausea,.,. Denies vomiting or fever

## 2016-01-12 NOTE — Discharge Instructions (Signed)
Disuria (Dysuria) La disuria es dolor o molestia al [employer]. El dolor o la molestia se pueden sentir en el conducto que transporta la orina fuera de la vejiga (uretra) o en el tejido que rodea los genitales. El dolor tambin se puede sentir en la zona de la ingle y en la parte inferior del abdomen y de la espalda. Quizs tenga que orinar con frecuencia o la sensacin repentina de tener que orinar (tenesmo vesical). La disuria puede afectar tanto a hombres como a mujeres, pero es ms comn en las mujeres. La causa puede deberse a muchos problemas diferentes:  Infeccin en las vas urinarias en mujeres.  Infeccin en los riones o la vejiga.  Clculos en los riones o la vejiga.  Ciertas enfermedades de transmisin sexual (ETS), como la clamidia.  Deshidratacin.  Inflamacin de la vagina.  Uso de ciertos medicamentos.  Uso de ciertos jabones o productos perfumados que provocan irritacin. INSTRUCCIONES PARA EL CUIDADO EN EL HOGAR Controle su disuria para ver si hay cambios. Las siguientes indicaciones pueden ayudar a Psychologist, educational [employer] pueda sentir:  Beba suficiente lquido para Pharmacologist la orina clara o de color amarillo plido.  Vace la vejiga con frecuencia. Evite retener la orina durante largos perodos.  Despus de defecar, las mujeres deben limpiarse desde adelante hacia atrs, usando el papel higinico solo [place].  Vace la vejiga despus de Management consultant.  Tome los medicamentos solamente como se lo haya indicado el mdico.  Si le recetaron antibiticos, asegrese de terminarlos, incluso si comienza a sentirse mejor.  Evite la cafena, el t y el alcohol. Estos productos pueden Theatre manager vejiga y Probation officer disuria. En los hombres, el alcohol puede irritar la prstata.  Concurra a todas las visitas de control como se lo haya indicado el mdico. Esto es importante.  Si le realizaron pruebas para Landscape architect causa de la disuria, es su  responsabilidad retirar los [place]. Consulte en el laboratorio o en el departamento en el que fue realizado el estudio cundo y cmo podr [employer]. Hable con el mdico si tiene [employer]. SOLICITE ATENCIN MDICA SI:  Siente dolor en la espalda o a los costados del cuerpo.  Tiene fiebre.  Tiene nuseas o vmitos.  Observa sangre en la orina.  Est orinando con ms frecuencia que lo habitual. SOLICITE ATENCIN MDICA DE INMEDIATO SI:  El dolor es intenso y no se alivia con los medicamentos.  No puede retener lquido.  Usted u otra persona advierten algn cambio en su funcin mental.  Tiene una frecuencia cardaca acelerada en reposo.  Tiene temblores o escalofros.  Se siente muy dbil.   Esta informacin no tiene Theme park manager el consejo del mdico. Asegrese de hacerle al mdico cualquier pregunta que tenga.   Document Released: 10/10/2007 Document Revised: 10/11/2014 Elsevier Interactive Patient Education 2016 [employer]. Fractura vertebral (Vertebral Fracture) Julieta Bellini vertebral es una quebradura de uno de los huesos que forman la columna vertebral (vrtebras). Las vrtebras estn apiladas una sobre otra para formar la columna vertebral, sirven de soporte del cuerpo y protegen la mdula espinal. La columna vertebral se compone de una porcin superior (columna cervical), una intermedia (columna dorsal) y Neomia Dear inferior (columna lumbar). La mayora de las fracturas vertebrales se producen en la columna dorsal o la lumbar. Hay tres tipos principales de fracturas vertebrales:  Fractura por flexin. Ocurre cuando hay aplastamiento vertebral. Las vrtebras pueden aplastarse:  En la parte anterior (fractura por aplastamiento).  Este tipo de Surveyor, mineralsfractura es frecuente en las personas que sufren una enfermedad que les debilita los huesos y los vuelve frgiles (osteoporosis). La fractura puede hacer que una persona pierda  estatura.  En la parte anterior y posterior (fractura por compresin axial).  Fractura por traccin. Ocurre cuando una fuerza externa disloca las vrtebras.  Fractura por torsin. Ocurre cuando la columna vertebral se curva mucho en una direccin. Este tipo de fractura puede causar la rotura de una vrtebra (fractura de apfisis transversa) o hacer que se desplace de su posicin normal (fractura-luxacin). Este tipo de Presenter, broadcastingfractura conlleva un alto riesgo de lesin de la mdula espinal. Las fracturas vertebrales pueden ser leves o muy graves. Los tipos ms graves son los que causan el desplazamiento de los huesos fracturados (inestables) y aquellas que lesionan o comprimen la mdula espinal. CAUSAS Por lo general, la causa de esta afeccin es una lesin violenta. Habitualmente, estas son las causas de este tipo de lesin:  Accidentes automovilsticos.  Cadas o saltos desde grandes alturas.  Colisiones en deportes de contacto.  Actos violentos, como un asalto o una herida de arma de fuego. FACTORES DE RIESGO Es ms probable que esta lesin se produzca en las personas que:  Tienen osteoporosis.  Practican deportes de contacto.  Estn en situaciones que podran tener como resultado cadas u otras lesiones violentas. SNTOMAS Los sntomas de esta lesin dependen de la [place]ubicacin y del tipo de Surveyor, mineralsfractura. El sntoma ms frecuente es el dolor de espalda que se vuelve ms intenso con el movimiento. Adems, pueden presentarse dificultades para pararse o caminar. Si la fractura da la mdula espinal o la est comprimiendo, tambin puede presentarse lo siguiente:  Entumecimiento.  Hormigueo.  Debilidad.  Prdida de la movilidad.  Prdida del control del intestino o de la vejiga. DIAGNSTICO Esta lesin se puede diagnosticar en funcin de los sntomas, la historia clnica y un examen fsico. Tambin pueden hacerle estudios de diagnstico por imgenes para confirmar el diagnstico. Estos pueden  incluir lo siguiente:  Radiografa de la columna.  Tomografa computarizada.  Resonancia magntica. TRATAMIENTO El tratamiento de esta lesin depende del tipo de Surveyor, mineralsfractura. Si la fractura est estable y no afecta la mdula espinal, se puede consolidar con un tratamiento no quirrgico, por ejemplo:  La toma de analgsicos.  El uso de un yeso o un dispositivo ortopdico.  Los ejercicios de fisioterapia. Si la fractura vertebral est inestable o afecta la mdula espinal, tal vez necesite tratamiento quirrgico, por ejemplo:  Laminectoma. En este procedimiento, se extirpa la seccin de la vrtebra que est comprimiendo la mdula espinal (ciruga de descompresin medular). Tambin se pueden extirpar fragmentos seos.  Fusin vertebral. Este procedimiento se realiza para estabilizar una fractura que est inestable. Las vrtebras se pueden unir entre s con un fragmento de hueso de otra parte del cuerpo (injerto) y Vanderbiltmantenerse en su lugar con vstagos, placas o tornillos.  Vertebroplastia. En este procedimiento, se utiliza cemento seo para reconstruir las vrtebras aplastadas. INSTRUCCIONES PARA EL CUIDADO EN EL HOGAR Instrucciones generales  Tome los medicamentos solamente como se lo haya indicado el mdico.  No conduzca ni opere maquinaria pesada mientras toma analgsicos.  Si se lo indican, aplique hielo sobre la zona lesionada:  Ponga el hielo en una bolsa plstica.  Coloque una toalla entre la piel y la bolsa de hielo.  Al principio, deje el hielo en el lugar durante 30 minutos cada dos horas. Luego aplquese hielo cuando sea necesario.  Use el soporte para el cuello  o la espalda como se lo haya indicado el mdico.  No beba alcohol. El alcohol puede interferir en el tratamiento.  Concurra a todas las visitas de control como se lo haya indicado el mdico. Esto es importante. Puede ayudar a Buyer, retail, la discapacidad y Chief Technology Officer prolongado  (crnico). Actividad  Haga reposo en cama solo como se lo haya indicado el mdico. El reposo en cama muy prolongado puede empeorar la afeccin.  Reanude sus actividades normales como se lo haya indicado el mdico. Pregunte qu actividades son seguras para usted.  Haga ejercicios para Production manager y Designer, jewellery espalda (fisioterapia) como se lo haya recomendado el mdico.  Haga ejercicio regularmente como se lo haya indicado el mdico. SOLICITE ATENCIN MDICA SI:  Lance Muss.  Tiene tos que hace que el dolor sea ms intenso.  El medicamento no IT trainer.  El dolor no mejora con el [place].  No puede retomar las actividades normales segn lo planeado o lo esperado. SOLICITE ATENCIN MDICA DE INMEDIATO SI:  El dolor es muy intenso y empeora repentinamente.  No puede mover alguna parte del cuerpo (parlisis) por debajo del nivel de la lesin.  Tiene entumecimiento, hormigueo o debilidad en alguna parte del cuerpo por debajo del nivel de la lesin.  No puede controlar los intestinos o la vejiga.   Esta informacin no tiene Theme park manager el consejo del mdico. Asegrese de hacerle al mdico cualquier pregunta que tenga.   Document Released: 09/20/2005 Document Revised: 02/04/2015 Elsevier Interactive Patient Education [employer]

## 2016-05-02 ENCOUNTER — Emergency Department: Payer: Medicare Other

## 2016-05-02 ENCOUNTER — Emergency Department
Admission: EM | Admit: 2016-05-02 | Discharge: 2016-05-02 | Disposition: A | Discharge status: Home / Self Care | Payer: Medicare Other | Attending: Emergency Medicine | Admitting: Emergency Medicine

## 2016-05-02 ENCOUNTER — Encounter: Payer: Self-pay | Admitting: Emergency Medicine

## 2016-05-02 DIAGNOSIS — K529 Noninfective gastroenteritis and colitis, unspecified: Secondary | ICD-10-CM | POA: Diagnosis not present

## 2016-05-02 DIAGNOSIS — Z794 Long term (current) use of insulin: Secondary | ICD-10-CM | POA: Diagnosis not present

## 2016-05-02 DIAGNOSIS — J45909 Unspecified asthma, uncomplicated: Secondary | ICD-10-CM | POA: Diagnosis not present

## 2016-05-02 DIAGNOSIS — Z79899 Other long term (current) drug therapy: Secondary | ICD-10-CM | POA: Diagnosis not present

## 2016-05-02 DIAGNOSIS — M1990 Unspecified osteoarthritis, unspecified site: Secondary | ICD-10-CM | POA: Diagnosis not present

## 2016-05-02 DIAGNOSIS — I10 Essential (primary) hypertension: Secondary | ICD-10-CM | POA: Insufficient documentation

## 2016-05-02 DIAGNOSIS — Z7952 Long term (current) use of systemic steroids: Secondary | ICD-10-CM | POA: Diagnosis not present

## 2016-05-02 DIAGNOSIS — I2510 Atherosclerotic heart disease of native coronary artery without angina pectoris: Secondary | ICD-10-CM | POA: Diagnosis not present

## 2016-05-02 DIAGNOSIS — J441 Chronic obstructive pulmonary disease with (acute) exacerbation: Secondary | ICD-10-CM | POA: Insufficient documentation

## 2016-05-02 DIAGNOSIS — R1084 Generalized abdominal pain: Secondary | ICD-10-CM

## 2016-05-02 DIAGNOSIS — Z7982 Long term (current) use of aspirin: Secondary | ICD-10-CM | POA: Diagnosis not present

## 2016-05-02 DIAGNOSIS — Z7984 Long term (current) use of oral hypoglycemic drugs: Secondary | ICD-10-CM | POA: Diagnosis not present

## 2016-05-02 LAB — COMPREHENSIVE METABOLIC PANEL
ALK PHOS: 98 U/L (ref 38–126)
ALT: 14 U/L (ref 14–54)
ANION GAP: 5 (ref 5–15)
AST: 18 U/L (ref 15–41)
Albumin: 3.5 g/dL (ref 3.5–5.0)
BILIRUBIN TOTAL: 0.4 mg/dL (ref 0.3–1.2)
BUN: 17 mg/dL (ref 6–20)
CALCIUM: 8.8 mg/dL — AB (ref 8.9–10.3)
CO2: 30 mmol/L (ref 22–32)
CREATININE: 0.64 mg/dL (ref 0.44–1.00)
Chloride: 104 mmol/L (ref 101–111)
GFR calc non Af Amer: >60 mL/min (ref >60)
GLUCOSE: 101 mg/dL — AB (ref 65–99)
Potassium: 3.7 mmol/L (ref 3.5–5.1)
Sodium: 139 mmol/L (ref 135–145)
TOTAL PROTEIN: 6.7 g/dL (ref 6.5–8.1)

## 2016-05-02 LAB — LIPASE, BLOOD: Lipase: 14 U/L (ref 11–51)

## 2016-05-02 LAB — URINALYSIS COMPLETE WITH MICROSCOPIC (ARMC ONLY)
BILIRUBIN URINE: NEGATIVE
Bacteria, UA: NONE SEEN
GLUCOSE, UA: NEGATIVE mg/dL
Hgb urine dipstick: NEGATIVE
KETONES UR: NEGATIVE mg/dL
Leukocytes, UA: NEGATIVE
NITRITE: NEGATIVE
PROTEIN: NEGATIVE mg/dL
SPECIFIC GRAVITY, URINE: 1.011 (ref 1.005–1.030)
Squamous Epithelial / LPF: NONE SEEN
pH: 5 (ref 5.0–8.0)

## 2016-05-02 LAB — TROPONIN I: Troponin I: <0.03 ng/mL (ref <0.03)

## 2016-05-02 LAB — CBC
HCT: 35.4 % (ref 35.0–47.0)
HEMOGLOBIN: 12.1 g/dL (ref 12.0–16.0)
MCH: 27 pg (ref 26.0–34.0)
MCHC: 34.3 g/dL (ref 32.0–36.0)
MCV: 78.8 fL — ABNORMAL LOW (ref 80.0–100.0)
PLATELETS: 199 10*3/uL (ref 150–440)
RBC: 4.49 MIL/uL (ref 3.80–5.20)
RDW: 14.9 % — ABNORMAL HIGH (ref 11.5–14.5)
WBC: 10.4 10*3/uL (ref 3.6–11.0)

## 2016-05-02 LAB — BRAIN NATRIURETIC PEPTIDE: B Natriuretic Peptide: 146 pg/mL — ABNORMAL HIGH (ref 0.0–100.0)

## 2016-05-02 MED ORDER — TRAMADOL HCL 50 MG PO TABS: 50.0000 mg | ORAL_TABLET | Freq: Four times a day (QID) | ORAL | 0 refills | Status: AC | PRN

## 2016-05-02 MED ORDER — ONDANSETRON HCL 4 MG/2ML IJ SOLN
4.0000 mg | Freq: Once | INTRAMUSCULAR | Status: AC | PRN
  Administered 2016-05-02: 4 mg via INTRAVENOUS
  Filled 2016-05-02: qty 2

## 2016-05-02 MED ORDER — FLEET ENEMA 7-19 GM/118ML RE ENEM
1.0000 | ENEMA | Freq: Once | RECTAL | Status: AC
  Administered 2016-05-02: 1 via RECTAL

## 2016-05-02 MED ORDER — IOPAMIDOL (ISOVUE-300) INJECTION 61%
100.0000 mL | Freq: Once | INTRAVENOUS | Status: AC | PRN
  Administered 2016-05-02: 100 mL via INTRAVENOUS

## 2016-05-02 MED ORDER — METRONIDAZOLE 500 MG PO TABS: 500.0000 mg | ORAL_TABLET | Freq: Two times a day (BID) | ORAL | 0 refills | Status: DC

## 2016-05-02 MED ORDER — MORPHINE SULFATE (PF) 4 MG/ML IV SOLN
4.0000 mg | Freq: Once | INTRAVENOUS | Status: AC
  Administered 2016-05-02: 4 mg via INTRAVENOUS
  Filled 2016-05-02: qty 1

## 2016-05-02 MED ORDER — DIATRIZOATE MEGLUMINE & SODIUM 66-10 % PO SOLN
15.0000 mL | Freq: Once | ORAL | Status: AC
  Administered 2016-05-02: 15 mL via ORAL

## 2016-05-02 MED ORDER — AMOXICILLIN-POT CLAVULANATE 875-125 MG PO TABS: 1.0000 | ORAL_TABLET | Freq: Two times a day (BID) | ORAL | 0 refills | Status: AC

## 2016-05-02 NOTE — ED Notes (Signed)
Pt back from CT

## 2016-05-02 NOTE — ED Notes (Signed)
Pt tolerated enema well.  Moderate formed stool resulted.  Pt states she feels "a little better"

## 2016-05-02 NOTE — ED Provider Notes (Signed)
[facility] Emergency Department Provider Note   ____________________________________________    I have reviewed the triage vital signs and the nursing notes.   HISTORY  Chief Complaint Abdominal Pain  Spanish interpreter used   HPI Dana Holloway is a 82 y.o. female who presents with abdominal pain. Patient apparently woke up at approximately 4-5 AM with diffuse cramping moderate to severe abdominal pain with nausea. She has had this in the past as well but it seems worse today. She denies fevers or chills. Normal bowel movement yesterday. Yesterday she felt well. No recent travel. No sick contacts. She does have a history of cholecystectomy. She reports her glucose has been well controlled   Past Medical History:  Diagnosis Date  . Arthritis   . Asthma   . Coronary artery disease   . Diabetes mellitus without complication (HCC)   . Gastritis   . Hypertension   . Orbital osteo-periostitis     Patient Active Problem List   Diagnosis Date Noted  . COPD with acute exacerbation (HCC) 11/15/2015  . Hypoxia 08/24/2015    Past Surgical History:  Procedure Laterality Date  . CARDIAC SURGERY     cardiac cath with stent 2014  . CHOLECYSTECTOMY    . PERCUTANEOUS CORONARY STENT INTERVENTION (PCI-S)      Prior to Admission medications   Medication Sig Start Date End Date Taking? Authorizing Provider  acetaminophen (TYLENOL) 325 MG tablet Take 650 mg by mouth 3 (three) times daily as needed for mild pain or moderate pain.    Historical Provider, MD  albuterol (PROVENTIL HFA;VENTOLIN HFA) 108 (90 BASE) MCG/ACT inhaler Inhale 2 puffs into the lungs every 6 (six) hours as needed for wheezing or shortness of breath.    Historical Provider, MD  aspirin EC 81 MG tablet Take 81 mg by mouth daily.    Historical Provider, MD  atorvastatin (LIPITOR) 10 MG tablet Take 10 mg by mouth at bedtime.    Historical Provider, MD  beclomethasone (QVAR) 80  MCG/ACT inhaler Inhale 2 puffs into the lungs 2 (two) times daily.    Historical Provider, MD  cholecalciferol (VITAMIN D) 1000 UNITS tablet Take 1,000 Units by mouth daily.    Historical Provider, MD  citalopram (CELEXA) 10 MG tablet Take 10 mg by mouth daily.    Historical Provider, MD  clopidogrel (PLAVIX) 75 MG tablet Take 75 mg by mouth daily.    Historical Provider, MD  diclofenac sodium (VOLTAREN) 1 % GEL Apply 1 application topically 4 (four) times daily.    Historical Provider, MD  docusate sodium (COLACE) 100 MG capsule Take 2 mg by mouth 2 (two) times daily as needed for mild constipation.    Historical Provider, MD  insulin glargine (LANTUS) 100 UNIT/ML injection Inject 35-42 Units into the skin at bedtime. 42 units in the morning and 35 units at night    Historical Provider, MD  losartan (COZAAR) 25 MG tablet Take 12.5 mg by mouth daily.    Historical Provider, MD  metFORMIN (GLUCOPHAGE) 500 MG tablet Take 500 mg by mouth 2 (two) times daily with a meal.    Historical Provider, MD  metoprolol succinate (TOPROL-XL) 25 MG 24 hr tablet Take 50 mg by mouth daily.    Historical Provider, MD  miconazole (MICOTIN) 2 % cream Apply 1 application topically 2 (two) times daily. Apply to feet    Historical Provider, MD  Multiple Vitamin (MULTIVITAMIN WITH MINERALS) TABS tablet Take 1 tablet by mouth  at bedtime.     Historical Provider, MD  nitroGLYCERIN (NITROSTAT) 0.4 MG SL tablet Place 0.4 mg under the tongue every 5 (five) minutes as needed for chest pain.    Historical Provider, MD  omeprazole (PRILOSEC) 40 MG capsule Take 40 mg by mouth daily.    Historical Provider, MD  predniSONE (DELTASONE) 10 MG tablet 40 mg po daily for 2 days, 20 mg po daily for 2 days, 10 mg po daily for 2 days. 11/17/15   Shaune Pollack, MD  pregabalin (LYRICA) 50 MG capsule Take 50 mg by mouth 2 (two) times daily.    Historical Provider, MD  promethazine (PHENERGAN) 12.5 MG tablet Take 1 tablet (12.5 mg total) by mouth every  8 (eight) hours as needed for nausea or vomiting. 12/26/15   Gayla Doss, MD  QUEtiapine (SEROQUEL) 25 MG tablet Take 25-50 mg by mouth at bedtime.    Historical Provider, MD  tolterodine (DETROL LA) 4 MG 24 hr capsule Take 1 capsule (4 mg total) by mouth daily. 01/12/16 01/11/17  Emily Filbert, MD  traMADol (ULTRAM) 50 MG tablet Take 1 tablet (50 mg total) by mouth every 6 (six) hours as needed. 01/12/16 01/11/17  Emily Filbert, MD  .   Allergies Bactrim [sulfamethoxazole-trimethoprim] and Sulfa antibiotics  Family History  Problem Relation Age of Onset  . Diabetes Mellitus II Other     Social History Social History  Substance Use Topics  . Smoking status: Never Smoker  . Smokeless tobacco: Never Used  . Alcohol use No    Review of Systems  Constitutional: No fever  ENT: No sore throat. Cardiovascular: Denies chest pain. Respiratory: Denies shortness of breath.No cough Gastrointestinal: As above  Genitourinary: Negative for dysuria. Musculoskeletal: Negative for back pain. Skin: Negative for rash. Neurological: Negative for headaches or weakness  10-point ROS otherwise negative.  ____________________________________________   PHYSICAL EXAM:  VITAL SIGNS: ED Triage Vitals  Enc Vitals Group     BP 05/02/16 0630 (!) 111/59     Pulse Rate 05/02/16 0630 65     Resp 05/02/16 0630 15     Temp 05/02/16 0631 97.6 F (36.4 C)     Temp Source 05/02/16 0631 Oral     SpO2 05/02/16 0630 96 %     Weight --      Height --      Head Circumference --      Peak Flow --      Pain Score 05/02/16 0639 10     Pain Loc --      Pain Edu? --      Excl. in GC? --    Constitutional: Alert and oriented. No acute distress. Uncomfortable appearing Eyes: Conjunctivae are normal.  Head: Atraumatic. Nose: No congestion/rhinnorhea. Mouth/Throat: Mucous membranes are dry   Cardiovascular: Normal rate, regular rhythm. Grossly normal heart sounds.  Good peripheral  circulation. Respiratory: Normal respiratory effort.  No retractions. Lungs CTAB. Gastrointestinal: Mild diffuse tenderness to palpation of the abdomen. No distention.  No CVA tenderness. Genitourinary: deferred Musculoskeletal: No lower extremity tenderness nor edema.  Warm and well perfused Neurologic:  Normal speech and language. No gross focal neurologic deficits are appreciated.  Skin:  Skin is warm, dry and intact. No rash noted. Psychiatric: Mood and affect are normal. behavior appears normal.  ____________________________________________   LABS (all labs ordered are listed, but only abnormal results are displayed)  Labs Reviewed  COMPREHENSIVE METABOLIC PANEL - Abnormal; Notable for the following:  Result Value   Glucose, Bld 101 (*)    Calcium 8.8 (*)    All other components within normal limits  CBC - Abnormal; Notable for the following:    MCV 78.8 (*)    RDW 14.9 (*)    All other components within normal limits  LIPASE, BLOOD  URINALYSIS COMPLETEWITH MICROSCOPIC ([facility] ONLY)  TROPONIN I  BRAIN NATRIURETIC PEPTIDE   ____________________________________________  EKG  ED ECG REPORT I, Jene Every, the attending physician, personally viewed and interpreted this ECG.  Date: 05/02/2016 EKG Time: 6:24 AM Rate: 65 Rhythm: normal sinus rhythm QRS Axis: normal Intervals: normal ST/T Wave abnormalities: normal Conduction Disturbances: none Narrative Interpretation: unremarkable  ____________________________________________  RADIOLOGY  CT scan consistent with mild colitis ____________________________________________   PROCEDURES  Procedure(s) performed: No    Critical Care performed: No ____________________________________________   INITIAL IMPRESSION / ASSESSMENT AND PLAN / ED COURSE  Pertinent labs & imaging results that were available during my care of the patient were reviewed by me and considered in my medical decision making (see chart  for details).  Patient presents with diffuse abdominal pain with nausea. We will treat with small dose of morphine and IV Zofran. She has had several CT scans in the past but reportedly this is much worse than typical. Suspect imaging will be required,  Clinical Course  Patient's CT scan shows possibility of mild colitis. Patient reports she feels she needs to have a bowel movement and that this will help her pain. She was given an enema which allowed her to have a large bowel movement. She reports her pain is markedly improved. Given her normal labs and unremarkable CT scan and the fact she is feeling much better feel she is appropriate for discharge. We discussed return precautions at length. ____________________________________________   FINAL CLINICAL IMPRESSION(S) / ED DIAGNOSES  Final diagnoses:  Colitis  Generalized abdominal pain      NEW MEDICATIONS STARTED DURING THIS VISIT:  New Prescriptions   No medications on file     Note:  This document was prepared using Dragon voice recognition software and may include unintentional dictation errors.    Jene Every, MD 05/02/16 1236

## 2016-05-02 NOTE — ED Triage Notes (Signed)
Per EMS, patient comes from home c/o abdominal pain that "feels like burning" and is 10/10 on the pain scale.  Pt has hx of hypertension and takes medicine for it.  Patient is Hispanic and speaks no English so iPad interpreter was at bedside during initial triage.  Patient is moaning as she is brought in by EMS.  EMS says she was satting 87-88 on RA but on putting on O2 went to 97% in the truck.  Patient is on 2L here.

## 2016-05-02 NOTE — ED Notes (Signed)
Family at bedside. 

## 2016-05-02 NOTE — ED Notes (Addendum)
Pt ambulated to commode for urine specimen.  Rates abdominal pain 8/10

## 2016-06-29 ENCOUNTER — Emergency Department
Admission: EM | Admit: 2016-06-29 | Discharge: 2016-06-29 | Disposition: A | Discharge status: Home / Self Care | Payer: Medicare Other | Source: Home / Self Care | Attending: Emergency Medicine | Admitting: Emergency Medicine

## 2016-06-29 ENCOUNTER — Encounter: Payer: Self-pay | Admitting: Emergency Medicine

## 2016-06-29 ENCOUNTER — Emergency Department
Admission: EM | Admit: 2016-06-29 | Discharge: 2016-06-29 | Disposition: A | Discharge status: Home / Self Care | Payer: Medicare Other | Attending: Emergency Medicine | Admitting: Emergency Medicine

## 2016-06-29 ENCOUNTER — Encounter: Payer: Self-pay | Admitting: Medical Oncology

## 2016-06-29 DIAGNOSIS — Z7984 Long term (current) use of oral hypoglycemic drugs: Secondary | ICD-10-CM

## 2016-06-29 DIAGNOSIS — Z794 Long term (current) use of insulin: Secondary | ICD-10-CM | POA: Diagnosis not present

## 2016-06-29 DIAGNOSIS — I2510 Atherosclerotic heart disease of native coronary artery without angina pectoris: Secondary | ICD-10-CM

## 2016-06-29 DIAGNOSIS — E119 Type 2 diabetes mellitus without complications: Secondary | ICD-10-CM

## 2016-06-29 DIAGNOSIS — J45909 Unspecified asthma, uncomplicated: Secondary | ICD-10-CM | POA: Insufficient documentation

## 2016-06-29 DIAGNOSIS — Z7982 Long term (current) use of aspirin: Secondary | ICD-10-CM | POA: Insufficient documentation

## 2016-06-29 DIAGNOSIS — E162 Hypoglycemia, unspecified: Secondary | ICD-10-CM

## 2016-06-29 DIAGNOSIS — I10 Essential (primary) hypertension: Secondary | ICD-10-CM | POA: Insufficient documentation

## 2016-06-29 DIAGNOSIS — A90 Dengue fever [classical dengue]: Secondary | ICD-10-CM | POA: Insufficient documentation

## 2016-06-29 DIAGNOSIS — Z791 Long term (current) use of non-steroidal anti-inflammatories (NSAID): Secondary | ICD-10-CM

## 2016-06-29 DIAGNOSIS — Z79899 Other long term (current) drug therapy: Secondary | ICD-10-CM

## 2016-06-29 DIAGNOSIS — E11649 Type 2 diabetes mellitus with hypoglycemia without coma: Secondary | ICD-10-CM | POA: Diagnosis not present

## 2016-06-29 DIAGNOSIS — J449 Chronic obstructive pulmonary disease, unspecified: Secondary | ICD-10-CM | POA: Insufficient documentation

## 2016-06-29 LAB — CBC WITH DIFFERENTIAL/PLATELET
BAND NEUTROPHILS: 0 %
BLASTS: 0 %
Basophils Absolute: 0 10*3/uL (ref 0–0.1)
Basophils Relative: 0 %
EOS ABS: 0.3 10*3/uL (ref 0–0.7)
Eosinophils Relative: 6 %
HEMATOCRIT: 39.5 % (ref 35.0–47.0)
Hemoglobin: 12.9 g/dL (ref 12.0–16.0)
LYMPHS PCT: 37 %
Lymphs Abs: 1.6 10*3/uL (ref 1.0–3.6)
MCH: 26.5 pg (ref 26.0–34.0)
MCHC: 32.6 g/dL (ref 32.0–36.0)
MCV: 81.2 fL (ref 80.0–100.0)
MONOS PCT: 10 %
Metamyelocytes Relative: 1 %
Monocytes Absolute: 0.4 10*3/uL (ref 0.2–0.9)
Myelocytes: 2 %
NEUTROS ABS: 2 10*3/uL (ref 1.4–6.5)
NRBC: 0 /100{WBCs}
Neutrophils Relative %: 44 %
OTHER: 0 %
Platelets: 184 10*3/uL (ref 150–440)
Promyelocytes Absolute: 0 %
RBC: 4.87 MIL/uL (ref 3.80–5.20)
RDW: 15.1 % — AB (ref 11.5–14.5)
WBC: 4.3 10*3/uL (ref 3.6–11.0)

## 2016-06-29 LAB — COMPREHENSIVE METABOLIC PANEL
ALT: 102 U/L — ABNORMAL HIGH (ref 14–54)
ANION GAP: 6 (ref 5–15)
AST: 119 U/L — ABNORMAL HIGH (ref 15–41)
Albumin: 3.1 g/dL — ABNORMAL LOW (ref 3.5–5.0)
Alkaline Phosphatase: 83 U/L (ref 38–126)
BUN: 9 mg/dL (ref 6–20)
CO2: 26 mmol/L (ref 22–32)
Calcium: 8.4 mg/dL — ABNORMAL LOW (ref 8.9–10.3)
Chloride: 107 mmol/L (ref 101–111)
Creatinine, Ser: 0.75 mg/dL (ref 0.44–1.00)
GLUCOSE: 103 mg/dL — AB (ref 65–99)
POTASSIUM: 4.3 mmol/L (ref 3.5–5.1)
SODIUM: 139 mmol/L (ref 135–145)
TOTAL PROTEIN: 6.5 g/dL (ref 6.5–8.1)
Total Bilirubin: 0.6 mg/dL (ref 0.3–1.2)

## 2016-06-29 LAB — CBC
HCT: 39.2 % (ref 35.0–47.0)
Hemoglobin: 12.7 g/dL (ref 12.0–16.0)
MCH: 26.4 pg (ref 26.0–34.0)
MCHC: 32.4 g/dL (ref 32.0–36.0)
MCV: 81.5 fL (ref 80.0–100.0)
PLATELETS: 194 10*3/uL (ref 150–440)
RBC: 4.81 MIL/uL (ref 3.80–5.20)
RDW: 14.8 % — AB (ref 11.5–14.5)
WBC: 4.9 10*3/uL (ref 3.6–11.0)

## 2016-06-29 LAB — BASIC METABOLIC PANEL
Anion gap: 7 (ref 5–15)
BUN: 8 mg/dL (ref 6–20)
CALCIUM: 8.3 mg/dL — AB (ref 8.9–10.3)
CO2: 24 mmol/L (ref 22–32)
Chloride: 110 mmol/L (ref 101–111)
Creatinine, Ser: 0.8 mg/dL (ref 0.44–1.00)
GFR calc Af Amer: >60 mL/min (ref >60)
GLUCOSE: 49 mg/dL — AB (ref 65–99)
Potassium: 3.7 mmol/L (ref 3.5–5.1)
Sodium: 141 mmol/L (ref 135–145)

## 2016-06-29 LAB — GLUCOSE, CAPILLARY
GLUCOSE-CAPILLARY: 114 mg/dL — AB (ref 65–99)
Glucose-Capillary: 41 mg/dL — CL (ref 65–99)
Glucose-Capillary: 51 mg/dL — ABNORMAL LOW (ref 65–99)
Glucose-Capillary: 101 mg/dL — ABNORMAL HIGH (ref 65–99)
Glucose-Capillary: 120 mg/dL — ABNORMAL HIGH (ref 65–99)

## 2016-06-29 LAB — LIPASE, BLOOD: Lipase: 49 U/L (ref 11–51)

## 2016-06-29 MED ORDER — ACETAMINOPHEN 325 MG PO TABS
650.0000 mg | ORAL_TABLET | Freq: Once | ORAL | Status: AC
  Administered 2016-06-29: 650 mg via ORAL
  Filled 2016-06-29: qty 2

## 2016-06-29 MED ORDER — ONDANSETRON HCL 4 MG/2ML IJ SOLN
4.0000 mg | Freq: Once | INTRAMUSCULAR | Status: AC
  Administered 2016-06-29: 4 mg via INTRAVENOUS
  Filled 2016-06-29: qty 2

## 2016-06-29 MED ORDER — MORPHINE SULFATE (PF) 2 MG/ML IV SOLN
2.0000 mg | Freq: Once | INTRAVENOUS | Status: AC
Start: 2016-06-29 — End: 2016-06-29
  Administered 2016-06-29: 2 mg via INTRAVENOUS
  Filled 2016-06-29: qty 1

## 2016-06-29 MED ORDER — SODIUM CHLORIDE 0.9 % IV BOLUS (SEPSIS)
1000.0000 mL | Freq: Once | INTRAVENOUS | Status: AC
  Administered 2016-06-29: 1000 mL via INTRAVENOUS

## 2016-06-29 MED ORDER — NAPROXEN 500 MG PO TABS: 500.0000 mg | ORAL_TABLET | Freq: Two times a day (BID) | ORAL | 0 refills | Status: DC

## 2016-06-29 MED ORDER — DEXTROSE 50 % IV SOLN
INTRAVENOUS | Status: AC
  Filled 2016-06-29: qty 50

## 2016-06-29 MED ORDER — ONDANSETRON 4 MG PO TBDP: 4.0000 mg | ORAL_TABLET | Freq: Three times a day (TID) | ORAL | 0 refills | Status: DC | PRN

## 2016-06-29 NOTE — ED Notes (Addendum)
Pt ambulates to use a toilet at bedside without difficulty, steady gait noted.

## 2016-06-29 NOTE — ED Triage Notes (Signed)
Recently discharged from Ed .[facility]. Per daughter she became unresponsive and diaphoretic when she got her home

## 2016-06-29 NOTE — ED Notes (Signed)
Pt given juice and eating from sandwich tray.

## 2016-06-29 NOTE — Discharge Instructions (Signed)
Please make sure that you are eating small, regular meals if you take your insulin.  Return to the emergency department for changes in mental status, pain, fever, lightheadedness or fainting, or for any other symptoms concerning to you.

## 2016-06-29 NOTE — ED Provider Notes (Signed)
[facility] Emergency Department Provider Note  ____________________________________________  Time seen: Approximately 1:31 PM  I have reviewed the triage vital signs and the nursing notes.   HISTORY  Chief Complaint insect bite and diagnosed with Dengue    HPI Marliss CzarFaviana Ma HillockFranco de Zuniga is a 82 y.o. female who complains of generalized body aches, fever, nausea, and generalized abdominal pain for the past week. She was visiting [place] for the past 3 weeks, and when the symptoms develop she was seen by doctor there who did labs and found that she definitively has dengue. Patient provides a copy of these lab results which are conclusive for acute infection. No vomiting or syncope. No chest pain or shortness of breath.     Past Medical History:  Diagnosis Date  . Arthritis   . Asthma   . Coronary artery disease   . Diabetes mellitus without complication (HCC)   . Gastritis   . Hypertension   . Orbital osteo-periostitis      Patient Active Problem List   Diagnosis Date Noted  . COPD with acute exacerbation (HCC) 11/15/2015  . Hypoxia 08/24/2015     Past Surgical History:  Procedure Laterality Date  . CARDIAC SURGERY     cardiac cath with stent 2014  . CHOLECYSTECTOMY    . PERCUTANEOUS CORONARY STENT INTERVENTION (PCI-S)       Prior to Admission medications   Medication Sig Start Date End Date Taking? Authorizing Provider  acetaminophen (TYLENOL) 325 MG tablet Take 650 mg by mouth 3 (three) times daily as needed for mild pain or moderate pain.    Historical Provider, MD  albuterol (PROVENTIL HFA;VENTOLIN HFA) 108 (90 BASE) MCG/ACT inhaler Inhale 2 puffs into the lungs every 6 (six) hours as needed for wheezing or shortness of breath.    Historical Provider, MD  aspirin EC 81 MG tablet Take 81 mg by mouth daily.    Historical Provider, MD  atorvastatin (LIPITOR) 10 MG tablet Take 10 mg by mouth at bedtime.    Historical Provider, MD   beclomethasone (QVAR) 80 MCG/ACT inhaler Inhale 2 puffs into the lungs 2 (two) times daily.    Historical Provider, MD  cholecalciferol (VITAMIN D) 1000 UNITS tablet Take 1,000 Units by mouth daily.    Historical Provider, MD  citalopram (CELEXA) 10 MG tablet Take 10 mg by mouth daily.    Historical Provider, MD  clopidogrel (PLAVIX) 75 MG tablet Take 75 mg by mouth daily.    Historical Provider, MD  diclofenac sodium (VOLTAREN) 1 % GEL Apply 1 application topically 4 (four) times daily.    Historical Provider, MD  docusate sodium (COLACE) 100 MG capsule Take 2 mg by mouth 2 (two) times daily as needed for mild constipation.    Historical Provider, MD  insulin glargine (LANTUS) 100 UNIT/ML injection Inject 35-42 Units into the skin at bedtime. 42 units in the morning and 35 units at night    Historical Provider, MD  losartan (COZAAR) 25 MG tablet Take 12.5 mg by mouth daily.    Historical Provider, MD  metFORMIN (GLUCOPHAGE) 500 MG tablet Take 500 mg by mouth 2 (two) times daily with a meal.    Historical Provider, MD  metoprolol succinate (TOPROL-XL) 25 MG 24 hr tablet Take 50 mg by mouth daily.    Historical Provider, MD  metroNIDAZOLE (FLAGYL) 500 MG tablet Take 1 tablet (500 mg total) by mouth 2 (two) times daily after a meal. 05/02/16   Jene Everyobert Kinner, MD  miconazole (  MICOTIN) 2 % cream Apply 1 application topically 2 (two) times daily. Apply to feet    Historical Provider, MD  Multiple Vitamin (MULTIVITAMIN WITH MINERALS) TABS tablet Take 1 tablet by mouth at bedtime.     Historical Provider, MD  naproxen (NAPROSYN) 500 MG tablet Take 1 tablet (500 mg total) by mouth 2 (two) times daily with a meal. 06/29/16   Sharman Cheek, MD  nitroGLYCERIN (NITROSTAT) 0.4 MG SL tablet Place 0.4 mg under the tongue every 5 (five) minutes as needed for chest pain.    Historical Provider, MD  omeprazole (PRILOSEC) 40 MG capsule Take 40 mg by mouth daily.    Historical Provider, MD  ondansetron (ZOFRAN ODT) 4  MG disintegrating tablet Take 1 tablet (4 mg total) by mouth every 8 (eight) hours as needed for nausea or vomiting. 06/29/16   Sharman Cheek, MD  predniSONE (DELTASONE) 10 MG tablet 40 mg po daily for 2 days, 20 mg po daily for 2 days, 10 mg po daily for 2 days. 11/17/15   Shaune Pollack, MD  pregabalin (LYRICA) 50 MG capsule Take 50 mg by mouth 2 (two) times daily.    Historical Provider, MD  promethazine (PHENERGAN) 12.5 MG tablet Take 1 tablet (12.5 mg total) by mouth every 8 (eight) hours as needed for nausea or vomiting. 12/26/15   Gayla Doss, MD  QUEtiapine (SEROQUEL) 25 MG tablet Take 25-50 mg by mouth at bedtime.    Historical Provider, MD  tolterodine (DETROL LA) 4 MG 24 hr capsule Take 1 capsule (4 mg total) by mouth daily. 01/12/16 01/11/17  Emily Filbert, MD  traMADol (ULTRAM) 50 MG tablet Take 1 tablet (50 mg total) by mouth every 6 (six) hours as needed. 05/02/16 05/02/17  Jene Every, MD     Allergies Bactrim [sulfamethoxazole-trimethoprim] and Sulfa antibiotics   Family History  Problem Relation Age of Onset  . Diabetes Mellitus II Other     Social History Social History  Substance Use Topics  . Smoking status: Never Smoker  . Smokeless tobacco: Never Used  . Alcohol use No    Review of Systems  Constitutional:   Positive fever and chills.  ENT:   No sore throat. No rhinorrhea. Cardiovascular:   No chest pain. No bleeding Respiratory:   No dyspnea or cough. Gastrointestinal:   Positive generalized abdominal pain. No vomiting or diarrhea.  Genitourinary:   Negative for dysuria or difficulty urinating. Musculoskeletal:   Negative for focal pain or swelling. Positive myalgias. Neurological:   Negative for headaches 10-point ROS otherwise negative.  ____________________________________________   PHYSICAL EXAM:  VITAL SIGNS: ED Triage Vitals  Enc Vitals Group     BP 06/29/16 1222 (!) 115/58     Pulse Rate 06/29/16 1222 85     Resp 06/29/16 1222 16      Temp 06/29/16 1222 99.1 F (37.3 C)     Temp Source 06/29/16 1222 Oral     SpO2 06/29/16 1222 92 %     Weight 06/29/16 1223 160 lb (72.6 kg)     Height 06/29/16 1223 5\' 2"  (1.575 m)     Head Circumference --      Peak Flow --      Pain Score 06/29/16 1227 6     Pain Loc --      Pain Edu? --      Excl. in GC? --     Vital signs reviewed, nursing assessments reviewed.   Constitutional:   Alert and oriented.  Well appearing and in no distress. Eyes:   No scleral icterus. No conjunctival pallor. PERRL. EOMI.  No nystagmus. ENT   Head:   Normocephalic and atraumatic.   Nose:   No congestion/rhinnorhea. No septal hematoma   Mouth/Throat:   MMM, no pharyngeal erythema. No peritonsillar mass.    Neck:   No stridor. No SubQ emphysema. No meningismus. Hematological/Lymphatic/Immunilogical:   No cervical lymphadenopathy. Cardiovascular:   RRR. Symmetric bilateral radial and DP pulses.  No murmurs.  Respiratory:   Normal respiratory effort without tachypnea nor retractions. Breath sounds are clear and equal bilaterally. No wheezes/rales/rhonchi. Gastrointestinal:   Soft without focal tenderness. Non distended. There is no CVA tenderness.  No rebound, rigidity, or guarding. Genitourinary:   deferred Musculoskeletal:   Nontender with normal range of motion in all extremities. No joint effusions.  No lower extremity tenderness.  No edema. Neurologic:   Normal speech and language.  CN 2-10 normal. Motor grossly intact. No gross focal neurologic deficits are appreciated.  Skin:    Skin is warm, dry and intact. No rash noted.  No petechiae, purpura, or bullae.  ____________________________________________    LABS (pertinent positives/negatives) (all labs ordered are listed, but only abnormal results are displayed) Labs Reviewed  COMPREHENSIVE METABOLIC PANEL - Abnormal; Notable for the following:       Result Value   Glucose, Bld 103 (*)    Calcium 8.4 (*)    Albumin 3.1 (*)     AST 119 (*)    ALT 102 (*)    All other components within normal limits  CBC WITH DIFFERENTIAL/PLATELET - Abnormal; Notable for the following:    RDW 15.1 (*)    All other components within normal limits  LIPASE, BLOOD   ____________________________________________   EKG  Interpreted by me Normal sinus rhythm rate of 83, left axis, normal intervals. Poor R-wave progression in anterior precordial leads. Normal ST segments T waves.  ____________________________________________    RADIOLOGY    ____________________________________________   PROCEDURES Procedures  ____________________________________________   INITIAL IMPRESSION / ASSESSMENT AND PLAN / ED COURSE  Pertinent labs & imaging results that were available during my care of the patient were reviewed by me and considered in my medical decision making (see chart for details).  Patient well appearing no acute distress. With acute dengue infection, will and comorbidities and age, we'll check labs and give IV fluids for hydration. I expect the patient will be suitable for outpatient follow-up with symptomatic management.   ----------------------------------------- 2:54 PM on 06/29/2016 -----------------------------------------  Labs and vital signs unremarkable. Tolerating oral intake. Feels better after symptomatic management. We'll prescribe naproxen and Zofran, follow closely with primary care clinic. She has confirmed dengue infection, but no evidence of encephalitis hemorrhage or shock. Platelets 190. The entering the critical phase of illness, not febrile on presentation here and yet not significantly edematous or dehydrated.    Clinical Course   ____________________________________________   FINAL CLINICAL IMPRESSION(S) / ED DIAGNOSES  Final diagnoses:  Dengue fever       Portions of this note were generated with dragon dictation software. Dictation errors may occur despite best attempts at  proofreading.    Sharman Cheek, MD 06/29/16 1455

## 2016-06-29 NOTE — ED Provider Notes (Signed)
[facility] Emergency Department Provider Note  ____________________________________________  Time seen: Approximately 4:19 PM  I have reviewed the triage vital signs and the nursing notes.   HISTORY  Chief Complaint Altered Mental Status    HPI Dana Holloway is a 82 y.o. female just discharged from the emergency department after diagnosis of dengue fever presenting for unresponsiveness. The patient and her daughter were driving home from the hospital when she suddenly became diaphoretic and unresponsive. On arrival to triage, the patient was protecting her airway but had decreased responsiveness and blood sugar was in the 40s. The patient's daughter said she received 35 units of Lantus this morning but did not eat during the entire course of her ED visit. There are no new symptoms, trauma, or pain since her discharge from the ED approximately 1 hour ago.   Past Medical History:  Diagnosis Date  . Arthritis   . Asthma   . Coronary artery disease   . Diabetes mellitus without complication (HCC)   . Gastritis   . Hypertension   . Orbital osteo-periostitis     Patient Active Problem List   Diagnosis Date Noted  . COPD with acute exacerbation (HCC) 11/15/2015  . Hypoxia 08/24/2015    Past Surgical History:  Procedure Laterality Date  . CARDIAC SURGERY     cardiac cath with stent 2014  . CHOLECYSTECTOMY    . PERCUTANEOUS CORONARY STENT INTERVENTION (PCI-S)      Current Outpatient Rx  . Order #: [id number] Class: Historical Med  . Order #: [id number] Class: Historical Med  . Order #: [id number] Class: Historical Med  . Order #: [id number] Class: Historical Med  . Order #: [id number] Class: Historical Med  . Order #: [id number] Class: Historical Med  . Order #: [id number] Class: Historical Med  . Order #: [id number] Class: Historical Med  . Order #: [id number] Class: Historical Med  . Order #: [id number] Class: Historical Med  . Order #: [id number] Class:  Historical Med  . Order #: [id number] Class: Historical Med  . Order #: [id number] Class: Historical Med  . Order #: [id number] Class: Historical Med  . Order #: [id number] Class: Print  . Order #: [id number] Class: Historical Med  . Order #: [id number] Class: Historical Med  . Order #: [id number] Class: Print  . Order #: [id number] Class: Historical Med  . Order #: [id number] Class: Historical Med  . Order #: [id number] Class: Print  . Order #: [id number] Class: Print  . Order #: [id number] Class: Historical Med  . Order #: [id number] Class: Print  . Order #: [id number] Class: Historical Med  . Order #: [id number] Class: Print  . Order #: [id number] Class: Print    Allergies Bactrim [sulfamethoxazole-trimethoprim] and Sulfa antibiotics  Family History  Problem Relation Age of Onset  . Diabetes Mellitus II Other     Social History Social History  Substance Use Topics  . Smoking status: Never Smoker  . Smokeless tobacco: Never Used  . Alcohol use No    Review of Systems Constitutional: Positive fever. Positive decreased responsiveness. Negative trauma. Eyes: No visual changes. ENT: No sore throat. No congestion or rhinorrhea. Cardiovascular: Denies chest pain. Denies palpitations. Respiratory: Denies shortness of breath.  No cough. Gastrointestinal: No abdominal pain.  Positive nausea, no vomiting.  No diarrhea.  No constipation. Genitourinary: Negative for dysuria. Musculoskeletal: Negative for back pain. Skin: Negative for rash. Neurological: Negative for headaches. No focal numbness, tingling or weakness.   10-point ROS otherwise negative.  ____________________________________________   PHYSICAL EXAM:  VITAL SIGNS: ED Triage Vitals  Enc Vitals Group     BP 06/29/16 1559 (!) 143/69  Pulse Rate 06/29/16 1559 73     Resp 06/29/16 1559 18     Temp 06/29/16 1559 97.9 F (36.6 C)     Temp Source 06/29/16 1559 Oral     SpO2 06/29/16 1559 98 %     Weight 06/29/16 1600 160 lb (72.6 kg)      Height --      Head Circumference --      Peak Flow --      Pain Score --      Pain Loc --      Pain Edu? --      Excl. in GC? --     Constitutional: Patient is alert and able to answer simple questions. Her GCS is 15. She is protecting her airway. She appears uncomfortable and is slow to answer. Eyes: Conjunctivae are normal.  EOMI. No scleral icterus. Head: Atraumatic. Nose: No congestion/rhinnorhea. Mouth/Throat: Mucous membranes are dry.  Neck: No stridor.  Supple.  No meningismus. Cardiovascular: Normal rate, regular rhythm. No murmurs, rubs or gallops.  Respiratory: Normal respiratory effort.  No accessory muscle use or retractions. Lungs CTAB.  No wheezes, rales or ronchi. Gastrointestinal: Obese. Soft, nontender and nondistended.  No guarding or rebound.  No peritoneal signs. Musculoskeletal: No LE edema. No ttp in the calves or palpable cords.  Negative Homan's sign. Neurologic:  A&Ox3.  Speech is clear.  Face and smile are symmetric.  EOMI.  Moves all extremities well. Skin:  Skin is warm, dry and intact. No rash noted. Psychiatric: Mood and affect are normal. Speech and behavior are normal.  Normal judgement.  ____________________________________________   LABS (all labs ordered are listed, but only abnormal results are displayed)  Labs Reviewed  CBC - Abnormal; Notable for the following:       Result Value   RDW 14.8 (*)    All other components within normal limits  BASIC METABOLIC PANEL - Abnormal; Notable for the following:    Glucose, Bld 49 (*)    Calcium 8.3 (*)    All other components within normal limits  GLUCOSE, CAPILLARY - Abnormal; Notable for the following:    Glucose-Capillary 41 (*)    All other components within normal limits  GLUCOSE, CAPILLARY - Abnormal; Notable for the following:    Glucose-Capillary 51 (*)    All other components within normal limits  GLUCOSE, CAPILLARY - Abnormal; Notable for the following:    Glucose-Capillary 101 (*)     All other components within normal limits  GLUCOSE, CAPILLARY - Abnormal; Notable for the following:    Glucose-Capillary 114 (*)    All other components within normal limits  CBG MONITORING, ED   ____________________________________________  EKG  ED ECG REPORT I, Rockne MenghiniNorman, Anne-Caroline, the attending physician, personally viewed and interpreted this ECG.   Date: 06/29/2016  EKG Time: 1629  Rate: 74  Rhythm: normal sinus rhythm  Axis: Leftward  Intervals:none  ST&T Change: Nonspecific T-wave inversion in V1. No ST elevation.  ____________________________________________  RADIOLOGY  No results found.  ____________________________________________   PROCEDURES  Procedure(s) performed: None  Procedures  Critical Care performed: No ____________________________________________   INITIAL IMPRESSION / ASSESSMENT AND PLAN / ED COURSE  Pertinent labs & imaging results that were available during my care of the patient were reviewed by me and considered in my medical decision making (see chart for details).  82 y.o. female with an episode of diaphoresis and decreased responsiveness, low blood sugar, after taking insulin this morning and not eating all day.  Overall, there are no new symptoms that would be suggestive of an acute process different than her dengue fever for which she was seen earlier. We'll get a screening EKG, and basic labs, but my plan is to feed the patient and she is protecting her airway and able to comply with this, and monitor her for 4 hours. If her blood sugar remains normal, she'll be discharged home.  ____________________________________________  FINAL CLINICAL IMPRESSION(S) / ED DIAGNOSES  Final diagnoses:  Hypoglycemia  Dengue fever    Clinical Course   ----------------------------------------- 7:28 PM on 06/29/2016 -----------------------------------------  The patient has normalized her blood sugar and has been eating and drinking  normally. Will plan to discharge her home at this time. She understands return precautions as well as follow-up instructions.   NEW MEDICATIONS STARTED DURING THIS VISIT:  New Prescriptions   No medications on file      Rockne Menghini, MD 06/29/16 1932

## 2016-06-29 NOTE — ED Triage Notes (Signed)
Pt reports 1 week ago she was in [place] and was bit by mosquito. Pt was seen by MD in [place] and was diagnosed with Dengue. Pt reports she since has been having fatigue, fever, weakness, headache.

## 2016-07-04 ENCOUNTER — Emergency Department: Payer: Medicare Other

## 2016-07-04 ENCOUNTER — Emergency Department
Admission: EM | Admit: 2016-07-04 | Discharge: 2016-07-04 | Disposition: A | Discharge status: Home / Self Care | Payer: Medicare Other | Attending: Emergency Medicine | Admitting: Emergency Medicine

## 2016-07-04 DIAGNOSIS — R42 Dizziness and giddiness: Secondary | ICD-10-CM | POA: Diagnosis present

## 2016-07-04 DIAGNOSIS — J45909 Unspecified asthma, uncomplicated: Secondary | ICD-10-CM | POA: Insufficient documentation

## 2016-07-04 DIAGNOSIS — J449 Chronic obstructive pulmonary disease, unspecified: Secondary | ICD-10-CM | POA: Diagnosis not present

## 2016-07-04 DIAGNOSIS — E119 Type 2 diabetes mellitus without complications: Secondary | ICD-10-CM | POA: Insufficient documentation

## 2016-07-04 DIAGNOSIS — I2510 Atherosclerotic heart disease of native coronary artery without angina pectoris: Secondary | ICD-10-CM | POA: Diagnosis not present

## 2016-07-04 DIAGNOSIS — Z791 Long term (current) use of non-steroidal anti-inflammatories (NSAID): Secondary | ICD-10-CM | POA: Insufficient documentation

## 2016-07-04 DIAGNOSIS — Z79899 Other long term (current) drug therapy: Secondary | ICD-10-CM | POA: Diagnosis not present

## 2016-07-04 DIAGNOSIS — I10 Essential (primary) hypertension: Secondary | ICD-10-CM | POA: Insufficient documentation

## 2016-07-04 LAB — COMPREHENSIVE METABOLIC PANEL
ALBUMIN: 3.2 g/dL — AB (ref 3.5–5.0)
ALT: 42 U/L (ref 14–54)
AST: 40 U/L (ref 15–41)
Alkaline Phosphatase: 86 U/L (ref 38–126)
Anion gap: 5 (ref 5–15)
BUN: 10 mg/dL (ref 6–20)
CHLORIDE: 104 mmol/L (ref 101–111)
CO2: 26 mmol/L (ref 22–32)
CREATININE: 0.78 mg/dL (ref 0.44–1.00)
Calcium: 8.6 mg/dL — ABNORMAL LOW (ref 8.9–10.3)
GFR calc Af Amer: >60 mL/min (ref >60)
GFR calc non Af Amer: >60 mL/min (ref >60)
Glucose, Bld: 161 mg/dL — ABNORMAL HIGH (ref 65–99)
Potassium: 4.1 mmol/L (ref 3.5–5.1)
SODIUM: 135 mmol/L (ref 135–145)
Total Bilirubin: 0.6 mg/dL (ref 0.3–1.2)
Total Protein: 6.8 g/dL (ref 6.5–8.1)

## 2016-07-04 LAB — CBC WITH DIFFERENTIAL/PLATELET
Basophils Absolute: 0.1 10*3/uL (ref 0–0.1)
Basophils Relative: 1 %
EOS ABS: 0.2 10*3/uL (ref 0–0.7)
EOS PCT: 5 %
HCT: 35.4 % (ref 35.0–47.0)
HEMOGLOBIN: 11.6 g/dL — AB (ref 12.0–16.0)
LYMPHS ABS: 1.2 10*3/uL (ref 1.0–3.6)
Lymphocytes Relative: 24 %
MCH: 26.8 pg (ref 26.0–34.0)
MCHC: 32.9 g/dL (ref 32.0–36.0)
MCV: 81.7 fL (ref 80.0–100.0)
MONOS PCT: 17 %
Monocytes Absolute: 0.9 10*3/uL (ref 0.2–0.9)
NEUTROS PCT: 53 %
Neutro Abs: 2.7 10*3/uL (ref 1.4–6.5)
Platelets: 293 10*3/uL (ref 150–440)
RBC: 4.33 MIL/uL (ref 3.80–5.20)
RDW: 14.9 % — ABNORMAL HIGH (ref 11.5–14.5)
WBC: 5.2 10*3/uL (ref 3.6–11.0)

## 2016-07-04 LAB — GLUCOSE, CAPILLARY: GLUCOSE-CAPILLARY: 143 mg/dL — AB (ref 65–99)

## 2016-07-04 LAB — TROPONIN I

## 2016-07-04 MED ORDER — ACETAMINOPHEN 325 MG PO TABS
650.0000 mg | ORAL_TABLET | Freq: Once | ORAL | Status: AC
  Administered 2016-07-04: 650 mg via ORAL

## 2016-07-04 MED ORDER — ONDANSETRON HCL 4 MG/2ML IJ SOLN
4.0000 mg | Freq: Once | INTRAMUSCULAR | Status: AC
  Administered 2016-07-04: 4 mg via INTRAVENOUS

## 2016-07-04 MED ORDER — GI COCKTAIL ~~LOC~~
ORAL | Status: AC
  Filled 2016-07-04: qty 30

## 2016-07-04 MED ORDER — DIAZEPAM 5 MG/ML IJ SOLN: 2.0000 mg | Freq: Once | INTRAMUSCULAR | Status: DC

## 2016-07-04 MED ORDER — GI COCKTAIL ~~LOC~~
30.0000 mL | Freq: Once | ORAL | Status: AC
  Administered 2016-07-04: 30 mL via ORAL

## 2016-07-04 MED ORDER — ONDANSETRON HCL 4 MG/2ML IJ SOLN
INTRAMUSCULAR | Status: AC
  Filled 2016-07-04: qty 2

## 2016-07-04 MED ORDER — MECLIZINE HCL 25 MG PO TABS
25.0000 mg | ORAL_TABLET | Freq: Once | ORAL | Status: AC
  Administered 2016-07-04: 25 mg via ORAL
  Filled 2016-07-04: qty 1

## 2016-07-04 MED ORDER — ACETAMINOPHEN 325 MG PO TABS
ORAL_TABLET | ORAL | Status: AC
  Filled 2016-07-04: qty 2

## 2016-07-04 NOTE — Discharge Instructions (Signed)
Please take the Antivert one pill 3 or 4 times a day. Be carefully can make you woozy. Please follow-up with your regular doctor or return here if you're worse or not any better in 3 or 4 days. You can use Tylenol for headache. Return here also for severe headache vomiting or fever.

## 2016-07-04 NOTE — ED Triage Notes (Signed)
Dizzines and chills

## 2016-07-04 NOTE — ED Triage Notes (Signed)
"  I don't feel well." Reports high blood pressure and nausea.  States started at 10 pm.  [facility].

## 2016-07-04 NOTE — ED Notes (Signed)
Pt states dizziness is better than on arrival. Dr. Darnelle Catalanmalinda states to give pt meclizine first then wait a half hour to reassess dizziness. If dizziness persists then give valium.

## 2016-07-04 NOTE — ED Notes (Signed)
Pt reports nausea improved and pain down from 8/10 to 4/10 after tylenol and zofran. Lights dimmed for comfort. Call bella t side. Warm blankets provided.

## 2016-07-04 NOTE — ED Provider Notes (Signed)
[facility] Emergency Department Provider Note   ____________________________________________   First MD Initiated Contact with Patient 07/04/16 [phone]     (approximate)  I have reviewed the triage vital signs and the nursing notes.   HISTORY  Chief Complaint Dizziness    HPI Dana Holloway is a 82 y.o. female who was recently in the hospital here for during the infection. She was at home today and developed dizziness and spinning and nausea. Blood pressure went up this happened about 10:00 tonight. She also had a headache which is still 6 out of 10. It's diffuse achy as I understand it is not pounding.   Past Medical History:  Diagnosis Date  . Arthritis   . Asthma   . Coronary artery disease   . Diabetes mellitus without complication (HCC)   . Gastritis   . Hypertension   . Orbital osteo-periostitis     Patient Active Problem List   Diagnosis Date Noted  . COPD with acute exacerbation (HCC) 11/15/2015  . Hypoxia 08/24/2015    Past Surgical History:  Procedure Laterality Date  . CARDIAC SURGERY     cardiac cath with stent 2014  . CHOLECYSTECTOMY    . PERCUTANEOUS CORONARY STENT INTERVENTION (PCI-S)      Prior to Admission medications   Medication Sig Start Date End Date Taking? Authorizing Provider  acetaminophen (TYLENOL) 325 MG tablet Take 650 mg by mouth 3 (three) times daily as needed for mild pain or moderate pain.    Historical Provider, MD  albuterol (PROVENTIL HFA;VENTOLIN HFA) 108 (90 BASE) MCG/ACT inhaler Inhale 2 puffs into the lungs every 6 (six) hours as needed for wheezing or shortness of breath.    Historical Provider, MD  aspirin EC 81 MG tablet Take 81 mg by mouth daily.    Historical Provider, MD  atorvastatin (LIPITOR) 10 MG tablet Take 10 mg by mouth at bedtime.    Historical Provider, MD  beclomethasone (QVAR) 80 MCG/ACT inhaler Inhale 2 puffs into the lungs 2 (two) times daily.    Historical Provider, MD    cholecalciferol (VITAMIN D) 1000 UNITS tablet Take 1,000 Units by mouth daily.    Historical Provider, MD  citalopram (CELEXA) 10 MG tablet Take 10 mg by mouth daily.    Historical Provider, MD  clopidogrel (PLAVIX) 75 MG tablet Take 75 mg by mouth daily.    Historical Provider, MD  diclofenac sodium (VOLTAREN) 1 % GEL Apply 1 application topically 4 (four) times daily.    Historical Provider, MD  docusate sodium (COLACE) 100 MG capsule Take 2 mg by mouth 2 (two) times daily as needed for mild constipation.    Historical Provider, MD  insulin glargine (LANTUS) 100 UNIT/ML injection Inject 35-42 Units into the skin at bedtime. 42 units in the morning and 35 units at night    Historical Provider, MD  losartan (COZAAR) 25 MG tablet Take 12.5 mg by mouth daily.    Historical Provider, MD  metFORMIN (GLUCOPHAGE) 500 MG tablet Take 500 mg by mouth 2 (two) times daily with a meal.    Historical Provider, MD  metoprolol succinate (TOPROL-XL) 25 MG 24 hr tablet Take 50 mg by mouth daily.    Historical Provider, MD  metroNIDAZOLE (FLAGYL) 500 MG tablet Take 1 tablet (500 mg total) by mouth 2 (two) times daily after a meal. 05/02/16   Jene Every, MD  miconazole (MICOTIN) 2 % cream Apply 1 application topically 2 (two) times daily. Apply to feet  Historical Provider, MD  Multiple Vitamin (MULTIVITAMIN WITH MINERALS) TABS tablet Take 1 tablet by mouth at bedtime.     Historical Provider, MD  naproxen (NAPROSYN) 500 MG tablet Take 1 tablet (500 mg total) by mouth 2 (two) times daily with a meal. 06/29/16   Sharman Cheek, MD  nitroGLYCERIN (NITROSTAT) 0.4 MG SL tablet Place 0.4 mg under the tongue every 5 (five) minutes as needed for chest pain.    Historical Provider, MD  omeprazole (PRILOSEC) 40 MG capsule Take 40 mg by mouth daily.    Historical Provider, MD  ondansetron (ZOFRAN ODT) 4 MG disintegrating tablet Take 1 tablet (4 mg total) by mouth every 8 (eight) hours as needed for nausea or vomiting.  06/29/16   Sharman Cheek, MD  predniSONE (DELTASONE) 10 MG tablet 40 mg po daily for 2 days, 20 mg po daily for 2 days, 10 mg po daily for 2 days. 11/17/15   Shaune Pollack, MD  pregabalin (LYRICA) 50 MG capsule Take 50 mg by mouth 2 (two) times daily.    Historical Provider, MD  promethazine (PHENERGAN) 12.5 MG tablet Take 1 tablet (12.5 mg total) by mouth every 8 (eight) hours as needed for nausea or vomiting. 12/26/15   Gayla Doss, MD  QUEtiapine (SEROQUEL) 25 MG tablet Take 25-50 mg by mouth at bedtime.    Historical Provider, MD  tolterodine (DETROL LA) 4 MG 24 hr capsule Take 1 capsule (4 mg total) by mouth daily. 01/12/16 01/11/17  Emily Filbert, MD  traMADol (ULTRAM) 50 MG tablet Take 1 tablet (50 mg total) by mouth every 6 (six) hours as needed. 05/02/16 05/02/17  Jene Every, MD    Allergies Bactrim [sulfamethoxazole-trimethoprim] and Sulfa antibiotics  Family History  Problem Relation Age of Onset  . Diabetes Mellitus II Other     Social History Social History  Substance Use Topics  . Smoking status: Never Smoker  . Smokeless tobacco: Never Used  . Alcohol use No    Review of Systems Constitutional: No fever/chills Eyes: No visual changes. ENT: No sore throat. Cardiovascular: Denies chest pain. Respiratory: Denies shortness of breath. Gastrointestinal: No abdominal pain.  No nausea, no vomiting.  No diarrhea.  No constipation. Genitourinary: Negative for dysuria. Musculoskeletal: Negative for back pain. Skin: Negative for rash. Neurological: Negative for focal weakness or numbness.  10-point ROS otherwise negative.  ____________________________________________   PHYSICAL EXAM:  VITAL SIGNS: ED Triage Vitals  Enc Vitals Group     BP 07/04/16 0309 (!) 129/54     Pulse Rate 07/04/16 0309 65     Resp 07/04/16 0309 (!) 22     Temp 07/04/16 0309 98.1 F (36.7 C)     Temp Source 07/04/16 0309 Oral     SpO2 07/04/16 0309 93 %     Weight 07/04/16 0315 170 lb  (77.1 kg)     Height --      Head Circumference --      Peak Flow --      Pain Score --      Pain Loc --      Pain Edu? --      Excl. in GC? --     Constitutional: Alert and oriented. Well appearing and in no acute distress. Eyes: Conjunctivae are normal. PERRL. EOMI.Fundi are somewhat difficult to see a do not see anything abnormal though there is no nystagmus Head: Atraumatic. Nose: No congestion/rhinnorhea. Mouth/Throat: Mucous membranes are moist.  Oropharynx non-erythematous. Neck: No stridor Cardiovascular: Normal rate,  regular rhythm. Grossly normal heart sounds.  Good peripheral circulation. Respiratory: Normal respiratory effort.  No retractions. Lungs CTAB. Gastrointestinal: Soft and nontender. No distention. No abdominal bruits. No CVA tenderness. Musculoskeletal: No lower extremity tenderness nor edema.  No joint effusions. Neurologic:  Normal speech and language. No gross focal neurologic deficits are appreciated. Cranial nerves II through XII are intact over much sure about visual fields cerebellar finger-nose is normal rapid alternating movements are slowed bilaterally is 5 over 5 throughout and do not get any indication numbness via The translator. Thrust testing of the head does not indicate a peripheral lesion. Skin:  Skin is warm, dry and intact. No rash noted. Psychiatric: Mood and affect are normal. Speech and behavior are normal.  ____________________________________________   LABS (all labs ordered are listed, but only abnormal results are displayed)  Labs Reviewed  COMPREHENSIVE METABOLIC PANEL - Abnormal; Notable for the following:       Result Value   Glucose, Bld 161 (*)    Calcium 8.6 (*)    Albumin 3.2 (*)    All other components within normal limits  CBC WITH DIFFERENTIAL/PLATELET - Abnormal; Notable for the following:    Hemoglobin 11.6 (*)    RDW 14.9 (*)    All other components within normal limits  GLUCOSE, CAPILLARY - Abnormal; Notable for  the following:    Glucose-Capillary 143 (*)    All other components within normal limits  TROPONIN I  CBG MONITORING, ED   ____________________________________________  EKG  EKG read and interpreted by me shows normal sinus rhythm rate of 61 left axis no acute ST-T wave changes there is decreased amplitude throughout ____________________________________________  RADIOLOGY Study Result   CLINICAL DATA:  Dizziness, headache, and back aches since 10 p.m. yesterday. Nausea.  EXAM: CT HEAD WITHOUT CONTRAST  TECHNIQUE: Contiguous axial images were obtained from the base of the skull through the vertex without intravenous contrast.  COMPARISON:  None.  FINDINGS: Brain: Mild diffuse cerebral atrophy. No ventricular dilatation. No mass effect or midline shift. No abnormal extra-axial fluid collections. Gray-white matter junctions are distinct. Basal cisterns are not effaced. No evidence of acute intracranial hemorrhage.  Vascular: No hyperdense vessel or unexpected calcification.  Skull: Normal. Negative for fracture or focal lesion.  Sinuses/Orbits: Opacification of some of the ethmoid air cells. No acute air-fluid levels. Mastoid air cells are not opacified.  Other: None.  IMPRESSION: No acute intracranial abnormalities.  Chronic cerebral atrophy.   Electronically Signed   By: Burman NievesWilliam  Stevens M.D.   On: 07/04/2016 04:19    Study Result   CLINICAL DATA:  82 y/o  F; dizziness and chills.  EXAM: MRI HEAD WITHOUT CONTRAST  TECHNIQUE: Multiplanar, multiecho pulse sequences of the brain and surrounding structures were obtained without intravenous contrast.  COMPARISON:  07/04/2016 CT head.  FINDINGS: Brain: No acute infarction, hemorrhage, hydrocephalus, extra-axial collection or mass lesion. Mild parenchymal volume loss and chronic microvascular ischemic changes.  Vascular: Normal flow voids.  Skull and upper cervical spine: Normal  marrow signal.  Sinuses/Orbits: Mild diffuse paranasal sinus mucosal thickening. No abnormal signal of mastoid air cells. Orbits are unremarkable.  Other: None.  IMPRESSION: No acute intracranial abnormality is identified. Mild chronic microvascular ischemic changes and parenchymal volume loss. Mild paranasal sinus disease.   Electronically Signed   By: Mitzi HansenLance  Furusawa-Stratton M.D.   On: 07/04/2016 05:08     ____________________________________________   PROCEDURES  Procedure(s) performed:  Procedures  Critical Care performed:   ____________________________________________   INITIAL  IMPRESSION / ASSESSMENT AND PLAN / ED COURSE  Pertinent labs & imaging results that were available during my care of the patient were reviewed by me and considered in my medical decision making (see chart for details).    Clinical Course  Patient returns from MRI has had some Tylenol reports her headache is much much better and her dizziness is almost gone. We will try the Antivert first.  ----------------------------------------- 6:18 AM on 07/04/2016 -----------------------------------------  Dizziness is now gone headache is almost gone patient reports some burning in her stomach which she did not tell us about before give her a GI cocktail and see if that helps. Plan on discharging her with Antivert ____________________________________________   FINAL CLINICAL IMPRESSION(S) / ED DIAGNOSES  Final diagnoses:  Dizziness      NEW MEDICATIONS STARTED DURING THIS VISIT:  New Prescriptions   No medications on file     Note:  This document was prepared using Dragon voice recognition software and may include unintentional dictation errors.    Arnaldo Natal, MD 07/04/16 [phone]

## 2016-07-04 NOTE — ED Notes (Signed)
Patient transported to MRI 

## 2016-07-04 NOTE — ED Notes (Addendum)
Pt reports improved dizziness, but states continues to have posterior headache. Pt appears much more comfortable. resps unlabored, vss. Pt now complains of burning in stomach.

## 2017-05-24 ENCOUNTER — Emergency Department: Payer: Medicare Other

## 2017-05-24 ENCOUNTER — Inpatient Hospital Stay
Admission: EM | Admit: 2017-05-24 | Discharge: 2017-05-27 | DRG: 175 | Disposition: A | Discharge status: Home Health | Payer: Medicare Other | Attending: Internal Medicine | Admitting: Internal Medicine

## 2017-05-24 ENCOUNTER — Encounter: Payer: Self-pay | Admitting: Emergency Medicine

## 2017-05-24 DIAGNOSIS — Z96651 Presence of right artificial knee joint: Secondary | ICD-10-CM | POA: Diagnosis present

## 2017-05-24 DIAGNOSIS — Z794 Long term (current) use of insulin: Secondary | ICD-10-CM | POA: Diagnosis not present

## 2017-05-24 DIAGNOSIS — I2699 Other pulmonary embolism without acute cor pulmonale: Principal | ICD-10-CM | POA: Diagnosis present

## 2017-05-24 DIAGNOSIS — I2510 Atherosclerotic heart disease of native coronary artery without angina pectoris: Secondary | ICD-10-CM | POA: Diagnosis present

## 2017-05-24 DIAGNOSIS — J9601 Acute respiratory failure with hypoxia: Secondary | ICD-10-CM

## 2017-05-24 DIAGNOSIS — J9621 Acute and chronic respiratory failure with hypoxia: Secondary | ICD-10-CM | POA: Diagnosis not present

## 2017-05-24 DIAGNOSIS — Z955 Presence of coronary angioplasty implant and graft: Secondary | ICD-10-CM | POA: Diagnosis not present

## 2017-05-24 DIAGNOSIS — Z7951 Long term (current) use of inhaled steroids: Secondary | ICD-10-CM

## 2017-05-24 DIAGNOSIS — Z791 Long term (current) use of non-steroidal anti-inflammatories (NSAID): Secondary | ICD-10-CM | POA: Diagnosis not present

## 2017-05-24 DIAGNOSIS — Z7902 Long term (current) use of antithrombotics/antiplatelets: Secondary | ICD-10-CM | POA: Diagnosis not present

## 2017-05-24 DIAGNOSIS — J441 Chronic obstructive pulmonary disease with (acute) exacerbation: Secondary | ICD-10-CM | POA: Diagnosis present

## 2017-05-24 DIAGNOSIS — Z7982 Long term (current) use of aspirin: Secondary | ICD-10-CM

## 2017-05-24 DIAGNOSIS — Z79899 Other long term (current) drug therapy: Secondary | ICD-10-CM

## 2017-05-24 DIAGNOSIS — E119 Type 2 diabetes mellitus without complications: Secondary | ICD-10-CM | POA: Diagnosis present

## 2017-05-24 DIAGNOSIS — M7989 Other specified soft tissue disorders: Secondary | ICD-10-CM

## 2017-05-24 DIAGNOSIS — I10 Essential (primary) hypertension: Secondary | ICD-10-CM | POA: Diagnosis present

## 2017-05-24 LAB — CBC WITH DIFFERENTIAL/PLATELET
Basophils Absolute: 0 10*3/uL (ref 0–0.1)
Basophils Relative: 0 %
EOS ABS: 0.6 10*3/uL (ref 0–0.7)
Eosinophils Relative: 6 %
HEMATOCRIT: 31.8 % — AB (ref 35.0–47.0)
HEMOGLOBIN: 10.3 g/dL — AB (ref 12.0–16.0)
LYMPHS ABS: 1.2 10*3/uL (ref 1.0–3.6)
LYMPHS PCT: 13 %
MCH: 26.6 pg (ref 26.0–34.0)
MCHC: 32.4 g/dL (ref 32.0–36.0)
MCV: 81.9 fL (ref 80.0–100.0)
MONOS PCT: 7 %
Monocytes Absolute: 0.7 10*3/uL (ref 0.2–0.9)
NEUTROS ABS: 7.2 10*3/uL — AB (ref 1.4–6.5)
NEUTROS PCT: 74 %
Platelets: 405 10*3/uL (ref 150–440)
RBC: 3.89 MIL/uL (ref 3.80–5.20)
RDW: 15 % — ABNORMAL HIGH (ref 11.5–14.5)
WBC: 9.7 10*3/uL (ref 3.6–11.0)

## 2017-05-24 LAB — COMPREHENSIVE METABOLIC PANEL
ALBUMIN: 3.5 g/dL (ref 3.5–5.0)
ALK PHOS: 91 U/L (ref 38–126)
ALT: 19 U/L (ref 14–54)
ANION GAP: 8 (ref 5–15)
AST: 25 U/L (ref 15–41)
BUN: 14 mg/dL (ref 6–20)
CALCIUM: 8.9 mg/dL (ref 8.9–10.3)
CHLORIDE: 98 mmol/L — AB (ref 101–111)
CO2: 31 mmol/L (ref 22–32)
Creatinine, Ser: 0.69 mg/dL (ref 0.44–1.00)
GFR calc Af Amer: >60 mL/min (ref >60)
GFR calc non Af Amer: >60 mL/min (ref >60)
GLUCOSE: 112 mg/dL — AB (ref 65–99)
Potassium: 4.2 mmol/L (ref 3.5–5.1)
SODIUM: 137 mmol/L (ref 135–145)
Total Bilirubin: 0.6 mg/dL (ref 0.3–1.2)
Total Protein: 7.2 g/dL (ref 6.5–8.1)

## 2017-05-24 LAB — GLUCOSE, CAPILLARY: Glucose-Capillary: 160 mg/dL — ABNORMAL HIGH (ref 65–99)

## 2017-05-24 LAB — APTT: aPTT: 29 seconds (ref 24–36)

## 2017-05-24 LAB — TROPONIN I: Troponin I: <0.03 ng/mL (ref <0.03)

## 2017-05-24 LAB — BRAIN NATRIURETIC PEPTIDE: B Natriuretic Peptide: 272 pg/mL — ABNORMAL HIGH (ref 0.0–100.0)

## 2017-05-24 LAB — MAGNESIUM: MAGNESIUM: 2 mg/dL (ref 1.7–2.4)

## 2017-05-24 LAB — PROTIME-INR
INR: 1.03
Prothrombin Time: 13.5 seconds (ref 11.4–15.2)

## 2017-05-24 LAB — LACTIC ACID, PLASMA: LACTIC ACID, VENOUS: 1.1 mmol/L (ref 0.5–1.9)

## 2017-05-24 MED ORDER — QUETIAPINE FUMARATE 25 MG PO TABS
25.0000 mg | ORAL_TABLET | Freq: Every day | ORAL | Status: DC
  Administered 2017-05-24 – 2017-05-26 (×3): 25 mg via ORAL
  Filled 2017-05-24 (×3): qty 1

## 2017-05-24 MED ORDER — CLOPIDOGREL BISULFATE 75 MG PO TABS
75.0000 mg | ORAL_TABLET | Freq: Every day | ORAL | Status: DC
  Administered 2017-05-25 – 2017-05-26 (×2): 75 mg via ORAL
  Filled 2017-05-24 (×2): qty 1

## 2017-05-24 MED ORDER — IPRATROPIUM-ALBUTEROL 0.5-2.5 (3) MG/3ML IN SOLN
3.0000 mL | Freq: Once | RESPIRATORY_TRACT | Status: AC
  Administered 2017-05-24: 3 mL via RESPIRATORY_TRACT
  Filled 2017-05-24: qty 3

## 2017-05-24 MED ORDER — MELOXICAM 7.5 MG PO TABS
7.5000 mg | ORAL_TABLET | Freq: Every day | ORAL | Status: DC
  Administered 2017-05-25 – 2017-05-27 (×3): 7.5 mg via ORAL
  Filled 2017-05-24 (×3): qty 1

## 2017-05-24 MED ORDER — APIXABAN 5 MG PO TABS: 5.0000 mg | ORAL_TABLET | Freq: Two times a day (BID) | ORAL | Status: DC

## 2017-05-24 MED ORDER — CITALOPRAM HYDROBROMIDE 20 MG PO TABS
10.0000 mg | ORAL_TABLET | Freq: Every day | ORAL | Status: DC
  Administered 2017-05-25 – 2017-05-27 (×3): 10 mg via ORAL
  Filled 2017-05-24 (×3): qty 1

## 2017-05-24 MED ORDER — ACETAMINOPHEN 650 MG RE SUPP: 650.0000 mg | Freq: Four times a day (QID) | RECTAL | Status: DC | PRN

## 2017-05-24 MED ORDER — INSULIN ASPART 100 UNIT/ML ~~LOC~~ SOLN
0.0000 [IU] | Freq: Three times a day (TID) | SUBCUTANEOUS | Status: DC
  Administered 2017-05-25: 3 [IU] via SUBCUTANEOUS
  Administered 2017-05-25: 2 [IU] via SUBCUTANEOUS
  Administered 2017-05-25 – 2017-05-26 (×2): 3 [IU] via SUBCUTANEOUS
  Administered 2017-05-26 – 2017-05-27 (×4): 2 [IU] via SUBCUTANEOUS
  Filled 2017-05-24 (×8): qty 1

## 2017-05-24 MED ORDER — ASPIRIN EC 81 MG PO TBEC
81.0000 mg | DELAYED_RELEASE_TABLET | Freq: Every day | ORAL | Status: DC
  Administered 2017-05-25 – 2017-05-27 (×3): 81 mg via ORAL
  Filled 2017-05-24 (×3): qty 1

## 2017-05-24 MED ORDER — POLYETHYLENE GLYCOL 3350 17 G PO PACK: 17.0000 g | PACK | Freq: Every day | ORAL | Status: DC | PRN

## 2017-05-24 MED ORDER — OXYCODONE HCL 5 MG PO TABS
5.0000 mg | ORAL_TABLET | ORAL | Status: DC | PRN
  Administered 2017-05-25 – 2017-05-27 (×7): 5 mg via ORAL
  Filled 2017-05-24 (×7): qty 1

## 2017-05-24 MED ORDER — INSULIN GLARGINE 100 UNIT/ML ~~LOC~~ SOLN
34.0000 [IU] | Freq: Every day | SUBCUTANEOUS | Status: DC
  Administered 2017-05-25 – 2017-05-27 (×3): 34 [IU] via SUBCUTANEOUS
  Filled 2017-05-24 (×4): qty 0.34

## 2017-05-24 MED ORDER — PREGABALIN 50 MG PO CAPS
50.0000 mg | ORAL_CAPSULE | Freq: Two times a day (BID) | ORAL | Status: DC
  Administered 2017-05-24 – 2017-05-27 (×6): 50 mg via ORAL
  Filled 2017-05-24 (×6): qty 1

## 2017-05-24 MED ORDER — METHYLPREDNISOLONE SODIUM SUCC 125 MG IJ SOLR
125.0000 mg | Freq: Once | INTRAMUSCULAR | Status: AC
  Administered 2017-05-24: 125 mg via INTRAVENOUS
  Filled 2017-05-24: qty 2

## 2017-05-24 MED ORDER — HEPARIN (PORCINE) IN NACL 100-0.45 UNIT/ML-% IJ SOLN
1150.0000 [IU]/h | INTRAMUSCULAR | Status: DC
  Filled 2017-05-24: qty 250

## 2017-05-24 MED ORDER — APIXABAN 5 MG PO TABS: 10.0000 mg | ORAL_TABLET | Freq: Two times a day (BID) | ORAL | Status: DC

## 2017-05-24 MED ORDER — ALBUTEROL SULFATE (2.5 MG/3ML) 0.083% IN NEBU
2.5000 mg | INHALATION_SOLUTION | RESPIRATORY_TRACT | Status: DC | PRN
  Administered 2017-05-26: 2.5 mg via RESPIRATORY_TRACT
  Filled 2017-05-24: qty 3

## 2017-05-24 MED ORDER — FAMOTIDINE 20 MG PO TABS
20.0000 mg | ORAL_TABLET | Freq: Every day | ORAL | Status: DC
  Administered 2017-05-24 – 2017-05-26 (×3): 20 mg via ORAL
  Filled 2017-05-24 (×3): qty 1

## 2017-05-24 MED ORDER — APIXABAN 5 MG PO TABS
10.0000 mg | ORAL_TABLET | Freq: Two times a day (BID) | ORAL | Status: DC
  Administered 2017-05-24 – 2017-05-27 (×6): 10 mg via ORAL
  Filled 2017-05-24 (×6): qty 2

## 2017-05-24 MED ORDER — IOPAMIDOL (ISOVUE-370) INJECTION 76%
75.0000 mL | Freq: Once | INTRAVENOUS | Status: AC | PRN
  Administered 2017-05-24: 75 mL via INTRAVENOUS

## 2017-05-24 MED ORDER — DOCUSATE SODIUM 100 MG PO CAPS
100.0000 mg | ORAL_CAPSULE | Freq: Two times a day (BID) | ORAL | Status: DC
  Administered 2017-05-24 – 2017-05-27 (×6): 100 mg via ORAL
  Filled 2017-05-24 (×6): qty 1

## 2017-05-24 MED ORDER — ALBUTEROL SULFATE (2.5 MG/3ML) 0.083% IN NEBU
2.5000 mg | INHALATION_SOLUTION | Freq: Once | RESPIRATORY_TRACT | Status: AC
  Administered 2017-05-24: 2.5 mg via RESPIRATORY_TRACT
  Filled 2017-05-24: qty 3

## 2017-05-24 MED ORDER — METFORMIN HCL 500 MG PO TABS
500.0000 mg | ORAL_TABLET | Freq: Every day | ORAL | Status: DC
  Administered 2017-05-25 – 2017-05-27 (×3): 500 mg via ORAL
  Filled 2017-05-24 (×3): qty 1

## 2017-05-24 MED ORDER — ONDANSETRON HCL 4 MG PO TABS: 4.0000 mg | ORAL_TABLET | Freq: Four times a day (QID) | ORAL | Status: DC | PRN

## 2017-05-24 MED ORDER — ONDANSETRON HCL 4 MG/2ML IJ SOLN: 4.0000 mg | Freq: Four times a day (QID) | INTRAMUSCULAR | Status: DC | PRN

## 2017-05-24 MED ORDER — METOPROLOL SUCCINATE ER 50 MG PO TB24
50.0000 mg | ORAL_TABLET | Freq: Every day | ORAL | Status: DC
  Administered 2017-05-25 – 2017-05-27 (×3): 50 mg via ORAL
  Filled 2017-05-24 (×3): qty 1

## 2017-05-24 MED ORDER — ACETAMINOPHEN 325 MG PO TABS: 650.0000 mg | ORAL_TABLET | Freq: Four times a day (QID) | ORAL | Status: DC | PRN

## 2017-05-24 MED ORDER — INSULIN ASPART 100 UNIT/ML ~~LOC~~ SOLN
0.0000 [IU] | Freq: Every day | SUBCUTANEOUS | Status: DC
  Administered 2017-05-26: 3 [IU] via SUBCUTANEOUS
  Filled 2017-05-24: qty 1

## 2017-05-24 MED ORDER — SODIUM CHLORIDE 0.9 % IV BOLUS (SEPSIS)
500.0000 mL | Freq: Once | INTRAVENOUS | Status: AC
  Administered 2017-05-24: 500 mL via INTRAVENOUS

## 2017-05-24 MED ORDER — MOMETASONE FURO-FORMOTEROL FUM 100-5 MCG/ACT IN AERO
2.0000 | INHALATION_SPRAY | Freq: Two times a day (BID) | RESPIRATORY_TRACT | Status: DC
  Administered 2017-05-25: 2 via RESPIRATORY_TRACT
  Filled 2017-05-24: qty 8.8

## 2017-05-24 MED ORDER — HEPARIN BOLUS VIA INFUSION
4000.0000 [IU] | Freq: Once | INTRAVENOUS | Status: DC
  Filled 2017-05-24: qty 4000

## 2017-05-24 NOTE — ED Triage Notes (Signed)
Pt to ed with c/o low oxygen levels at home and nausea.  Per family pt was nauseated on Sunday but today was sob.

## 2017-05-24 NOTE — ED Notes (Signed)
Attempted to call report

## 2017-05-24 NOTE — ED Notes (Signed)
Bedside report given to adrian, rn on telemetry.

## 2017-05-24 NOTE — ED Notes (Signed)
Patient transported to CT 

## 2017-05-24 NOTE — ED Notes (Signed)
Not able to take pt to floor or call report at this time. Awaiting charge rn Lexi on 2A to calll nursing supervisor.

## 2017-05-24 NOTE — ED Notes (Signed)
Pt with pox of 88% when moving around. Pt placed on venti mask at 55%, with rebound pox to 95%.

## 2017-05-24 NOTE — ED Notes (Signed)
Report from ashley, rn.  

## 2017-05-24 NOTE — ED Provider Notes (Signed)
[facility] Emergency Department Provider Note    None    (approximate)  I have reviewed the triage vital signs and the nursing notes.   HISTORY  Chief Complaint Shortness of Breath    HPI Dana Holloway is a 83 y.o. female presents with chief complaint of shortness of breath or lightheadedness weakness and chills. Symptoms primarily got worse over the past 2 days. Patient does have a history of bronchitis but is not on any home oxygen. She came in to triage and was found to be hypoxic to the low 80s. Has also been having a productive cough. She is on aspirin and Plavix for history of CAD with stent in 2012. She is also status post recent right knee arthroplasty. Not on any Coumadin or L Oquist. No personal history of DVT. States that yesterday she was having some nausea with vomiting but none today. Denies any chest pain right now.   Past Medical History:  Diagnosis Date  . Arthritis   . Asthma   . Coronary artery disease   . Diabetes mellitus without complication (HCC)   . Gastritis   . Hypertension   . Orbital osteo-periostitis    Family History  Problem Relation Age of Onset  . Diabetes Mellitus II Other    Past Surgical History:  Procedure Laterality Date  . CARDIAC SURGERY     cardiac cath with stent 2014  . CHOLECYSTECTOMY    . PERCUTANEOUS CORONARY STENT INTERVENTION (PCI-S)     Patient Active Problem List   Diagnosis Date Noted  . COPD with acute exacerbation (HCC) 11/15/2015  . Hypoxia 08/24/2015      Prior to Admission medications   Medication Sig Start Date End Date Taking? Authorizing Provider  acetaminophen (TYLENOL) 500 MG tablet Take 1,000 mg by mouth every 8 (eight) hours as needed.   Yes [provider]  albuterol (PROVENTIL HFA;VENTOLIN HFA) 108 (90 BASE) MCG/ACT inhaler Inhale 2 puffs into the lungs every 6 (six) hours as needed for wheezing or shortness of breath.   Yes [provider]    aspirin EC 81 MG tablet Take 81 mg by mouth daily.   Yes [provider]  atorvastatin (LIPITOR) 10 MG tablet Take 10 mg by mouth at bedtime.   Yes [provider]  budesonide-formoterol (SYMBICORT) 80-4.5 MCG/ACT inhaler Inhale 2 puffs into the lungs 2 (two) times daily.   Yes [provider]  cholecalciferol (VITAMIN D) 1000 UNITS tablet Take 1,000 Units by mouth daily.   Yes [provider]  citalopram (CELEXA) 10 MG tablet Take 10 mg by mouth daily.   Yes [provider]  clopidogrel (PLAVIX) 75 MG tablet Take 75 mg by mouth daily.   Yes [provider]  docusate sodium (COLACE) 100 MG capsule Take 2 mg by mouth 2 (two) times daily as needed for mild constipation.   Yes [provider]  insulin glargine (LANTUS) 100 UNIT/ML injection Inject 40 Units into the skin 2 (two) times daily. 42 units in the morning and 35 units at night    Yes [provider]  losartan (COZAAR) 50 MG tablet Take 50 mg by mouth daily.    Yes [provider]  meloxicam (MOBIC) 7.5 MG tablet Take 7.5 mg by mouth daily.   Yes [provider]  metFORMIN (GLUCOPHAGE) 500 MG tablet Take 500 mg by mouth daily with breakfast.    Yes [provider]  metoprolol succinate (TOPROL-XL) 25 MG  24 hr tablet Take 50 mg by mouth daily.   Yes [provider]  miconazole (MICOTIN) 2 % cream Apply 1 application topically 2 (two) times daily. Apply to feet   Yes [provider]  Multiple Vitamin (MULTIVITAMIN WITH MINERALS) TABS tablet Take 1 tablet by mouth at bedtime.    Yes [provider]  nitroGLYCERIN (NITROSTAT) 0.4 MG SL tablet Place 0.4 mg under the tongue every 5 (five) minutes as needed for chest pain.   Yes [provider]  omeprazole (PRILOSEC) 20 MG capsule Take 20 mg by mouth daily.    Yes [provider]  oxycodone (OXY-IR) 5 MG capsule Take 10 mg by mouth every 4 (four) hours as  needed.   Yes [provider]  pregabalin (LYRICA) 50 MG capsule Take 50 mg by mouth 2 (two) times daily.   Yes [provider]  QUEtiapine (SEROQUEL) 25 MG tablet Take 25-50 mg by mouth at bedtime.   Yes [provider]  ranitidine (ZANTAC) 150 MG tablet Take 150 mg by mouth at bedtime.   Yes [provider]  acetaminophen (TYLENOL) 325 MG tablet Take 650 mg by mouth 3 (three) times daily as needed for mild pain or moderate pain.    [provider]  diclofenac sodium (VOLTAREN) 1 % GEL Apply 1 application topically 4 (four) times daily.    [provider]  metroNIDAZOLE (FLAGYL) 500 MG tablet Take 1 tablet (500 mg total) by mouth 2 (two) times daily after a meal. Patient not taking: Reported on 05/24/2017 05/02/16   Jene Every, MD  naproxen (NAPROSYN) 500 MG tablet Take 1 tablet (500 mg total) by mouth 2 (two) times daily with a meal. Patient not taking: Reported on 05/24/2017 06/29/16   Sharman Cheek, MD  ondansetron (ZOFRAN ODT) 4 MG disintegrating tablet Take 1 tablet (4 mg total) by mouth every 8 (eight) hours as needed for nausea or vomiting. Patient not taking: Reported on 05/24/2017 06/29/16   Sharman Cheek, MD  predniSONE (DELTASONE) 10 MG tablet 40 mg po daily for 2 days, 20 mg po daily for 2 days, 10 mg po daily for 2 days. Patient not taking: Reported on 05/24/2017 11/17/15   Shaune Pollack, MD  promethazine (PHENERGAN) 12.5 MG tablet Take 1 tablet (12.5 mg total) by mouth every 8 (eight) hours as needed for nausea or vomiting. Patient not taking: Reported on 05/24/2017 12/26/15   Gayla Doss, MD    Allergies Bactrim [sulfamethoxazole-trimethoprim] and Sulfa antibiotics    Social History Social History  Substance Use Topics  . Smoking status: Never Smoker  . Smokeless tobacco: Never Used  . Alcohol use No    Review of Systems Patient denies headaches, rhinorrhea, blurry vision, numbness, shortness of breath, chest  pain, edema, cough, abdominal pain, nausea, vomiting, diarrhea, dysuria, fevers, rashes or hallucinations unless otherwise stated above in HPI. ____________________________________________   PHYSICAL EXAM:  VITAL SIGNS: Vitals:   05/24/17 1830 05/24/17 1900  BP: 134/64 (!) 120/58  Pulse: (!) 108 (!) 108  Resp: (!) 23 13  Temp:    SpO2: 94% (!) 89%    Constitutional: Alert and oriented ill appearing in moderate resp distress. Eyes: Conjunctivae are normal.  Head: Atraumatic. Nose: No congestion/rhinnorhea. Mouth/Throat: Mucous membranes are moist.   Neck: No stridor. Painless ROM.  Cardiovascular: tachycardic rate, regular rhythm. Grossly normal heart sounds.  Good peripheral circulation. Respiratory: Normal respiratory effort.  No retractions. Lungs with coarse bibasilar breathsounds Gastrointestinal: Soft and nontender. No  distention. No abdominal bruits. No CVA tenderness. Musculoskeletal: + RLE swelling and pitting edema and arthroplasty incision otherwise cdi Neurologic:  Normal speech and language. No gross focal neurologic deficits are appreciated. No facial droop Skin:  Skin is warm, dry and intact. No rash noted. Psychiatric: Mood and affect are normal. Speech and behavior are normal.  ____________________________________________   LABS (all labs ordered are listed, but only abnormal results are displayed)  Results for orders placed or performed during the hospital encounter of 05/24/17 (from the past 24 hour(s))  Blood gas, venous ([facility], [facility], [facility])     Status: Abnormal (Preliminary result)   Collection Time: 05/24/17  4:56 PM  Result Value Ref Range   FIO2 0.28    Delivery systems NO CHARGE    pH, Ven 7.33 7.250 - 7.430   pCO2, Ven 70 (H) 44.0 - 60.0 mmHg   pO2, Ven <31.0 (LL) 32.0 - 45.0 mmHg   Bicarbonate 36.9 (H) 20.0 - 28.0 mmol/L   Acid-Base Excess 9.0 (H) 0.0 - 2.0 mmol/L   O2 Saturation PENDING %   Patient temperature 37.0    Collection site VENOUS     Sample type VENOUS   Lactic acid, plasma     Status: None   Collection Time: 05/24/17  5:12 PM  Result Value Ref Range   Lactic Acid, Venous 1.1 0.5 - 1.9 mmol/L  Comprehensive metabolic panel     Status: Abnormal   Collection Time: 05/24/17  5:12 PM  Result Value Ref Range   Sodium 137 135 - 145 mmol/L   Potassium 4.2 3.5 - 5.1 mmol/L   Chloride 98 (L) 101 - 111 mmol/L   CO2 31 22 - 32 mmol/L   Glucose, Bld 112 (H) 65 - 99 mg/dL   BUN 14 6 - 20 mg/dL   Creatinine, Ser 6.78 0.44 - 1.00 mg/dL   Calcium 8.9 8.9 - 93.8 mg/dL   Total Protein 7.2 6.5 - 8.1 g/dL   Albumin 3.5 3.5 - 5.0 g/dL   AST 25 15 - 41 U/L   ALT 19 14 - 54 U/L   Alkaline Phosphatase 91 38 - 126 U/L   Total Bilirubin 0.6 0.3 - 1.2 mg/dL   GFR calc non Af Amer >60 >60 mL/min   GFR calc Af Amer >60 >60 mL/min   Anion gap 8 5 - 15  Troponin I     Status: None   Collection Time: 05/24/17  5:12 PM  Result Value Ref Range   Troponin I <0.03 <0.03 ng/mL  CBC WITH DIFFERENTIAL     Status: Abnormal   Collection Time: 05/24/17  5:12 PM  Result Value Ref Range   WBC 9.7 3.6 - 11.0 K/uL   RBC 3.89 3.80 - 5.20 MIL/uL   Hemoglobin 10.3 (L) 12.0 - 16.0 g/dL   HCT 10.1 (L) 75.1 - 02.5 %   MCV 81.9 80.0 - 100.0 fL   MCH 26.6 26.0 - 34.0 pg   MCHC 32.4 32.0 - 36.0 g/dL   RDW 85.2 (H) 77.8 - 24.2 %   Platelets 405 150 - 440 K/uL   Neutrophils Relative % 74 %   Neutro Abs 7.2 (H) 1.4 - 6.5 K/uL   Lymphocytes Relative 13 %   Lymphs Abs 1.2 1.0 - 3.6 K/uL   Monocytes Relative 7 %   Monocytes Absolute 0.7 0.2 - 0.9 K/uL   Eosinophils Relative 6 %   Eosinophils Absolute 0.6 0 - 0.7 K/uL   Basophils Relative 0 %  Basophils Absolute 0.0 0 - 0.1 K/uL  Magnesium     Status: None   Collection Time: 05/24/17  5:12 PM  Result Value Ref Range   Magnesium 2.0 1.7 - 2.4 mg/dL  Brain natriuretic peptide     Status: Abnormal   Collection Time: 05/24/17  5:12 PM  Result Value Ref Range   B Natriuretic Peptide 272.0 (H) 0.0 -  100.0 pg/mL   ____________________________________________  EKG My review and personal interpretation at Time: 16:51   Indication: sob  Rate: 95  Rhythm: sinus Axis: normal Other: low voltage, no stemi, normal intervals ____________________________________________  RADIOLOGY  I personally reviewed all radiographic images ordered to evaluate for the above acute complaints and reviewed radiology reports and findings.  These findings were personally discussed with the patient.  Please see medical record for radiology report.  ____________________________________________   PROCEDURES  Procedure(s) performed:  Procedures    Critical Care performed: yes CRITICAL CARE Performed by: Willy Eddy   Total critical care time: .45 minutes  Critical care time was exclusive of separately billable procedures and treating other patients.  Critical care was necessary to treat or prevent imminent or life-threatening deterioration.  Critical care was time spent personally by me on the following activities: development of treatment plan with patient and/or surrogate as well as nursing, discussions with consultants, evaluation of patient's response to treatment, examination of patient, obtaining history from patient or surrogate, ordering and performing treatments and interventions, ordering and review of laboratory studies, ordering and review of radiographic studies, pulse oximetry and re-evaluation of patient's condition.  ____________________________________________   INITIAL IMPRESSION / ASSESSMENT AND PLAN / ED COURSE  Pertinent labs & imaging results that were available during my care of the patient were reviewed by me and considered in my medical decision making (see chart for details).  DDX: Asthma, copd, CHF, pna, ptx, malignancy, Pe, anemia   Thailyn anica alcaraz is a 83 y.o. who presents to the ED with presents with chief complaint shortness of breath with acute  respiratory failure with hypoxia as described above. Patient placed on supplemental oxygen with improvement in her saturations. Patient given nebulizer treatments that she does have a history of bronchitis. EKG shows low voltage but no evidence of STEMI. Her abdominal exam is soft and benign. Patient recent postop status with a swollen right leg I do have a high clinical suspicion for pulmonary embolism therefore patient will be taken emergently to CT scanner for evaluation of pulmonary embolism.  The patient will be placed on continuous pulse oximetry and telemetry for monitoring.  Laboratory evaluation will be sent to evaluate for the above complaints.     Clinical Course as of May 24 1930  Tue May 24, 2017  1610 Patient with evidence of pulmonary embolism which would explain the patient's hypoxia. There is no evidence of right heart strain but based on her frailty recent surgery and presentation do feel the patient will require admission for heparinization.  [PR]    Clinical Course User Index [PR] Willy Eddy, MD     ____________________________________________   FINAL CLINICAL IMPRESSION(S) / ED DIAGNOSES  Final diagnoses:  Acute respiratory failure with hypoxia (HCC)  Other acute pulmonary embolism without acute cor pulmonale (HCC)      NEW MEDICATIONS STARTED DURING THIS VISIT:  New Prescriptions   No medications on file     Note:  This document was prepared using Dragon voice recognition software and may include unintentional dictation errors.    Roxan Hockey,  Luisa Hart, MD 05/24/17 [phone]

## 2017-05-24 NOTE — H&P (Signed)
[employer] [facility] - [place] at [facility]   PATIENT NAME: Dana Holloway    MR#:  [id number]  DATE OF BIRTH:  1934-04-25  DATE OF ADMISSION:  05/24/2017  PRIMARY CARE PHYSICIAN: [facility]   REQUESTING/REFERRING PHYSICIAN: Dr. Roxan Hockey  CHIEF COMPLAINT:   Chief Complaint  Patient presents with  . Shortness of Breath    HISTORY OF PRESENT ILLNESS:  Dana Holloway  is a 83 y.o. female with a known history of CAD, hypertension, diabetes, COPD, recent right total knee arthroplasty at [facility] 10 days back presents to the emergency room complaining of right leg swelling and shortness of breath. Patient has had some sputum. Saturations 80% on room air and presently on 3 L oxygen. CT scan of the chest has shown a right lobar artery PE. No signs of right heart strain. Patient is being admitted due to acute pulmonary embolism and acute hypoxic respiratory failure.  She was sent home from [facility] on aspirin and start Plavix 4 days after surgery.  PAST MEDICAL HISTORY:   Past Medical History:  Diagnosis Date  . Arthritis   . Asthma   . Coronary artery disease   . Diabetes mellitus without complication (HCC)   . Gastritis   . Hypertension   . Orbital osteo-periostitis     PAST SURGICAL HISTORY:   Past Surgical History:  Procedure Laterality Date  . CARDIAC SURGERY     cardiac cath with stent 2014  . CHOLECYSTECTOMY    . PERCUTANEOUS CORONARY STENT INTERVENTION (PCI-S)      SOCIAL HISTORY:   Social History  Substance Use Topics  . Smoking status: Never Smoker  . Smokeless tobacco: Never Used  . Alcohol use No    FAMILY HISTORY:   Family History  Problem Relation Age of Onset  . Diabetes Mellitus II Other     DRUG ALLERGIES:   Allergies  Allergen Reactions  . Bactrim [Sulfamethoxazole-Trimethoprim] Hives  . Sulfa Antibiotics Hives    REVIEW OF SYSTEMS:   Review of Systems  Constitutional: Positive for malaise/fatigue.  Negative for chills, fever and weight loss.  HENT: Negative for hearing loss and nosebleeds.   Eyes: Negative for blurred vision, double vision and pain.  Respiratory: Positive for cough, sputum production and shortness of breath. Negative for hemoptysis and wheezing.   Cardiovascular: Negative for chest pain, palpitations, orthopnea and leg swelling.  Gastrointestinal: Negative for abdominal pain, constipation, diarrhea, nausea and vomiting.  Genitourinary: Negative for dysuria and hematuria.  Musculoskeletal: Positive for joint pain. Negative for back pain, falls and myalgias.  Skin: Negative for rash.  Neurological: Positive for weakness. Negative for dizziness, tremors, sensory change, speech change, focal weakness, seizures and headaches.  Endo/Heme/Allergies: Does not bruise/bleed easily.  Psychiatric/Behavioral: Negative for depression and memory loss. The patient is not nervous/anxious.     MEDICATIONS AT HOME:   Prior to Admission medications   Medication Sig Start Date End Date Taking? Authorizing Provider  acetaminophen (TYLENOL) 500 MG tablet Take 1,000 mg by mouth every 8 (eight) hours as needed.   Yes [provider]  albuterol (PROVENTIL HFA;VENTOLIN HFA) 108 (90 BASE) MCG/ACT inhaler Inhale 2 puffs into the lungs every 6 (six) hours as needed for wheezing or shortness of breath.   Yes [provider]  aspirin EC 81 MG tablet Take 81 mg by mouth daily.   Yes [provider]  atorvastatin (LIPITOR) 10 MG tablet Take 10 mg by mouth at bedtime.   Yes  [provider]  budesonide-formoterol (SYMBICORT) 80-4.5 MCG/ACT inhaler Inhale 2 puffs into the lungs 2 (two) times daily.   Yes [provider]  cholecalciferol (VITAMIN D) 1000 UNITS tablet Take 1,000 Units by mouth daily.   Yes [provider]  citalopram (CELEXA) 10 MG tablet Take 10 mg by mouth daily.   Yes [provider]  clopidogrel (PLAVIX) 75 MG tablet Take 75  mg by mouth daily.   Yes [provider]  docusate sodium (COLACE) 100 MG capsule Take 2 mg by mouth 2 (two) times daily as needed for mild constipation.   Yes [provider]  insulin glargine (LANTUS) 100 UNIT/ML injection Inject 40 Units into the skin 2 (two) times daily. 42 units in the morning and 35 units at night    Yes [provider]  losartan (COZAAR) 50 MG tablet Take 50 mg by mouth daily.    Yes [provider]  meloxicam (MOBIC) 7.5 MG tablet Take 7.5 mg by mouth daily.   Yes [provider]  metFORMIN (GLUCOPHAGE) 500 MG tablet Take 500 mg by mouth daily with breakfast.    Yes [provider]  metoprolol succinate (TOPROL-XL) 25 MG 24 hr tablet Take 50 mg by mouth daily.   Yes [provider]  miconazole (MICOTIN) 2 % cream Apply 1 application topically 2 (two) times daily. Apply to feet   Yes [provider]  Multiple Vitamin (MULTIVITAMIN WITH MINERALS) TABS tablet Take 1 tablet by mouth at bedtime.    Yes [provider]  nitroGLYCERIN (NITROSTAT) 0.4 MG SL tablet Place 0.4 mg under the tongue every 5 (five) minutes as needed for chest pain.   Yes [provider]  omeprazole (PRILOSEC) 20 MG capsule Take 20 mg by mouth daily.    Yes [provider]  oxycodone (OXY-IR) 5 MG capsule Take 10 mg by mouth every 4 (four) hours as needed.   Yes [provider]  pregabalin (LYRICA) 50 MG capsule Take 50 mg by mouth 2 (two) times daily.   Yes [provider]  QUEtiapine (SEROQUEL) 25 MG tablet Take 25-50 mg by mouth at bedtime.   Yes [provider]  ranitidine (ZANTAC) 150 MG tablet Take 150 mg by mouth at bedtime.   Yes [provider]  acetaminophen (TYLENOL) 325 MG tablet Take 650 mg by mouth 3 (three) times daily as needed for mild pain or moderate pain.    [provider]  diclofenac sodium (VOLTAREN) 1 % GEL Apply 1 application topically 4  (four) times daily.    [provider]  metroNIDAZOLE (FLAGYL) 500 MG tablet Take 1 tablet (500 mg total) by mouth 2 (two) times daily after a meal. Patient not taking: Reported on 05/24/2017 05/02/16   Jene Every, MD  naproxen (NAPROSYN) 500 MG tablet Take 1 tablet (500 mg total) by mouth 2 (two) times daily with a meal. Patient not taking: Reported on 05/24/2017 06/29/16   Sharman Cheek, MD  ondansetron (ZOFRAN ODT) 4 MG disintegrating tablet Take 1 tablet (4 mg total) by mouth every 8 (eight) hours as needed for nausea or vomiting. Patient not taking: Reported on 05/24/2017 06/29/16   Sharman Cheek, MD  predniSONE (DELTASONE) 10 MG tablet 40 mg po daily for 2 days, 20 mg po daily for 2 days, 10 mg po daily for 2 days. Patient not taking: Reported on 05/24/2017 11/17/15   Shaune Pollack, MD  promethazine (PHENERGAN) 12.5 MG tablet Take 1 tablet (12.5 mg  total) by mouth every 8 (eight) hours as needed for nausea or vomiting. Patient not taking: Reported on 05/24/2017 12/26/15   Gayla Doss, MD     VITAL SIGNS:  Blood pressure (!) 118/98, pulse (!) 125, temperature 98.2 F (36.8 C), temperature source Oral, resp. rate (!) 22, weight 77.1 kg (170 lb), SpO2 100 %.  PHYSICAL EXAMINATION:  Physical Exam  GENERAL:  83 y.o.-year-old patient lying in the bed with no acute distress.  EYES: Pupils equal, round, reactive to light and accommodation. No scleral icterus. Extraocular muscles intact.  HEENT: Head atraumatic, normocephalic. Oropharynx and nasopharynx clear. No oropharyngeal erythema, moist oral mucosa  NECK:  Supple, no jugular venous distention. No thyroid enlargement, no tenderness.  LUNGS: Normal breath sounds bilaterally, no wheezing, rales, rhonchi. No use of accessory muscles of respiration.  CARDIOVASCULAR: S1, S2 normal. No murmurs, rubs, or gallops.  ABDOMEN: Soft, nontender, nondistended. Bowel sounds present. No organomegaly or mass.  EXTREMITIES: Right leg edema.  Dressing over scar from recent right knee surgery. Tender. NEUROLOGIC: Cranial nerves II through XII are intact. No focal Motor or sensory deficits appreciated b/l PSYCHIATRIC: The patient is alert and oriented x 3. Good affect.  SKIN: No obvious rash, lesion, or ulcer.   LABORATORY PANEL:   CBC  Recent Labs Lab 05/24/17 1712  WBC 9.7  HGB 10.3*  HCT 31.8*  PLT 405   ------------------------------------------------------------------------------------------------------------------  Chemistries   Recent Labs Lab 05/24/17 1712  NA 137  K 4.2  CL 98*  CO2 31  GLUCOSE 112*  BUN 14  CREATININE 0.69  CALCIUM 8.9  MG 2.0  AST 25  ALT 19  ALKPHOS 91  BILITOT 0.6   ------------------------------------------------------------------------------------------------------------------  Cardiac Enzymes  Recent Labs Lab 05/24/17 1712  TROPONINI <0.03   ------------------------------------------------------------------------------------------------------------------  RADIOLOGY:  Ct Angio Chest Pe W And/or Wo Contrast  Result Date: 05/24/2017 CLINICAL DATA:  Shortness of breath and nausea. EXAM: CT ANGIOGRAPHY CHEST WITH CONTRAST TECHNIQUE: Multidetector CT imaging of the chest was performed using the standard protocol during bolus administration of intravenous contrast. Multiplanar CT image reconstructions and MIPs were obtained to evaluate the vascular anatomy. CONTRAST:  75 cc Isovue 370 intravenously. COMPARISON:  08/24/2015 FINDINGS: Cardiovascular: Satisfactory opacification of the pulmonary arteries to the segmental level. There are lobar and segmental pulmonary emboli within the right upper lobe. No pericardial effusion. Calcific atherosclerotic disease of the coronary arteries. LAD stent. No evidence of right heart strain. Mediastinum/Nodes: Shotty mediastinal lymph nodes, the largest measuring 8 mm in short axis. Lungs/Pleura: Lungs are clear. No pleural effusion or  pneumothorax. Upper Abdomen: No acute abnormality. Musculoskeletal: Stable compression deformity of T9 vertebral body. Multilevel osteoarthritic changes. Focal asymmetry in the lower outer quadrant of the right breast, image 43/93. Review of the MIP images confirms the above findings. IMPRESSION: Lobar and segmental partially occlusive pulmonary emboli within the right upper lobe pulmonary arteries. No evidence of right heart strain. Focal asymmetry in the lower outer quadrant of the right breast. Please correlate to mammography and focused right breast ultrasound. These results were called by telephone at the time of interpretation on 05/24/2017 at 7:09 pm to Dr. Willy Eddy , who verbally acknowledged these results. Electronically Signed   By: Ted Mcalpine M.D.   On: 05/24/2017 19:12   Dg Chest Port 1 View  Result Date: 05/24/2017 CLINICAL DATA:  Low oxygen levels. EXAM: PORTABLE CHEST 1 VIEW COMPARISON:  May 02, 2016 FINDINGS: Cardiomegaly. The hila are poorly evaluated due to technique. The  mediastinum is unchanged. No nodules or masses. No focal infiltrates. IMPRESSION: Limited study due to the low volume portable technique with no acute abnormalities identified. Electronically Signed   By: Gerome Sam III M.D   On: 05/24/2017 17:17     IMPRESSION AND PLAN:   * Acute pulmonary embolus, right lobar artery with acute hypoxic respiratory failure Postop complications from recent right knee arthroplasty We'll start patient on Eliquis. Oxygen to keep saturations over 92%. Check echocardiogram. Check right lower extremity venous Dopplers. May need home oxygen at discharge. Consult case management for Eliquis.  * Insulin-dependent diabetes mellitus. Will continue her home Lantus at a lower dose. Added sliding scale insulin.  * Hypertension. Patient's blood pressure in the low normal range. We'll continue her metoprolol but hold losartan.  * Recent right knee surgery. We'll request  physical therapy evaluation. Pain medications as needed  All the records are reviewed and case discussed with ED provider. Management plans discussed with the patient, family and they are in agreement.  CODE STATUS: FULL CODE  TOTAL TIME TAKING CARE OF THIS PATIENT: 40 minutes.   Milagros Loll R M.D on 05/24/2017 at 8:09 PM  Between 7am to 6pm - Pager - [phone]  After 6pm go to [email or web address] - password [email or web address] [facility]  [facility]  Office  [phone]  CC: Primary care physician; [employer] [employer]  Note: This dictation was prepared with Nurse, children's dictation along with smaller Lobbyist. Any transcriptional errors that result from this process are unintentional.

## 2017-05-24 NOTE — ED Notes (Signed)
Pt assisted to bedpan by ed tech mayra.

## 2017-05-24 NOTE — Progress Notes (Signed)
ANTICOAGULATION CONSULT NOTE - Initial Consult  Pharmacy Consult for heparin Indication: pulmonary embolus  Allergies  Allergen Reactions  . Bactrim [Sulfamethoxazole-Trimethoprim] Hives  . Sulfa Antibiotics Hives    Patient Measurements: Weight: 170 lb (77.1 kg) Heparin Dosing Weight: 67 kg  Vital Signs: Temp: 98.2 F (36.8 C) (08/21 1637) Temp Source: Oral (08/21 1637) BP: 118/98 (08/21 1930) Pulse Rate: 125 (08/21 1930)  Labs:  Recent Labs  05/24/17 1712  HGB 10.3*  HCT 31.8*  PLT 405  CREATININE 0.69  TROPONINI <0.03    CrCl cannot be calculated (Unknown ideal weight.).   Medical History: Past Medical History:  Diagnosis Date  . Arthritis   . Asthma   . Coronary artery disease   . Diabetes mellitus without complication (HCC)   . Gastritis   . Hypertension   . Orbital osteo-periostitis    Assessment: Pharmacy consulted to dose and monitor heparin drip in this 83 year old female diagnosed with a PE. Patient was not taking anticoagulants PTA. Baseline labs ordered.  Confirmed patient's height of 62 inches with family members to determine heparin dosing weight = 67 kg.  Goal of Therapy:  Heparin level 0.3-0.7 units/ml Monitor platelets by anticoagulation protocol: Yes   Plan:  Give 4000 units bolus x 1 Start heparin infusion at 1150 units/hr Check anti-Xa level in 6 hours and daily while on heparin Continue to monitor H&H and platelets  Cindi Carbon, PharmD Clinical Pharmacist 05/24/2017,7:57 PM

## 2017-05-24 NOTE — ED Notes (Signed)
Pt ambulatory to toilet with one assist.  

## 2017-05-24 NOTE — ED Notes (Signed)
hospitalist in to see pt.

## 2017-05-25 ENCOUNTER — Observation Stay: Payer: Medicare Other

## 2017-05-25 ENCOUNTER — Inpatient Hospital Stay (HOSPITAL_COMMUNITY)
Admit: 2017-05-25 | Discharge: 2017-05-25 | Disposition: A | Discharge status: Home / Self Care | Payer: Medicare Other | Attending: Internal Medicine | Admitting: Internal Medicine

## 2017-05-25 DIAGNOSIS — I2510 Atherosclerotic heart disease of native coronary artery without angina pectoris: Secondary | ICD-10-CM | POA: Diagnosis not present

## 2017-05-25 DIAGNOSIS — Z96651 Presence of right artificial knee joint: Secondary | ICD-10-CM | POA: Diagnosis not present

## 2017-05-25 DIAGNOSIS — I340 Nonrheumatic mitral (valve) insufficiency: Secondary | ICD-10-CM | POA: Diagnosis not present

## 2017-05-25 DIAGNOSIS — Z955 Presence of coronary angioplasty implant and graft: Secondary | ICD-10-CM | POA: Diagnosis not present

## 2017-05-25 DIAGNOSIS — Z7982 Long term (current) use of aspirin: Secondary | ICD-10-CM | POA: Diagnosis not present

## 2017-05-25 DIAGNOSIS — Z7951 Long term (current) use of inhaled steroids: Secondary | ICD-10-CM | POA: Diagnosis not present

## 2017-05-25 DIAGNOSIS — J9621 Acute and chronic respiratory failure with hypoxia: Secondary | ICD-10-CM | POA: Diagnosis not present

## 2017-05-25 DIAGNOSIS — I2699 Other pulmonary embolism without acute cor pulmonale: Secondary | ICD-10-CM | POA: Diagnosis not present

## 2017-05-25 DIAGNOSIS — I351 Nonrheumatic aortic (valve) insufficiency: Secondary | ICD-10-CM | POA: Diagnosis not present

## 2017-05-25 DIAGNOSIS — Z791 Long term (current) use of non-steroidal anti-inflammatories (NSAID): Secondary | ICD-10-CM | POA: Diagnosis not present

## 2017-05-25 DIAGNOSIS — J441 Chronic obstructive pulmonary disease with (acute) exacerbation: Secondary | ICD-10-CM | POA: Diagnosis not present

## 2017-05-25 DIAGNOSIS — I10 Essential (primary) hypertension: Secondary | ICD-10-CM | POA: Diagnosis not present

## 2017-05-25 DIAGNOSIS — J9601 Acute respiratory failure with hypoxia: Secondary | ICD-10-CM | POA: Diagnosis present

## 2017-05-25 DIAGNOSIS — E119 Type 2 diabetes mellitus without complications: Secondary | ICD-10-CM | POA: Diagnosis not present

## 2017-05-25 DIAGNOSIS — Z794 Long term (current) use of insulin: Secondary | ICD-10-CM | POA: Diagnosis not present

## 2017-05-25 DIAGNOSIS — Z79899 Other long term (current) drug therapy: Secondary | ICD-10-CM | POA: Diagnosis not present

## 2017-05-25 DIAGNOSIS — Z7902 Long term (current) use of antithrombotics/antiplatelets: Secondary | ICD-10-CM | POA: Diagnosis not present

## 2017-05-25 LAB — BLOOD GAS, VENOUS
ACID-BASE EXCESS: 9 mmol/L — AB (ref 0.0–2.0)
BICARBONATE: 36.9 mmol/L — AB (ref 20.0–28.0)
FIO2: 0.28
PCO2 VEN: 70 mmHg — AB (ref 44.0–60.0)
PH VEN: 7.33 (ref 7.250–7.430)
Patient temperature: 37
pO2, Ven: <31 mmHg — CL (ref 32.0–45.0)

## 2017-05-25 LAB — GLUCOSE, CAPILLARY
GLUCOSE-CAPILLARY: 168 mg/dL — AB (ref 65–99)
GLUCOSE-CAPILLARY: 152 mg/dL — AB (ref 65–99)
Glucose-Capillary: 153 mg/dL — ABNORMAL HIGH (ref 65–99)
Glucose-Capillary: 129 mg/dL — ABNORMAL HIGH (ref 65–99)

## 2017-05-25 LAB — ECHOCARDIOGRAM COMPLETE
HEIGHTINCHES: 60 in
WEIGHTICAEL: 2880 [oz_av]

## 2017-05-25 LAB — HEMOGLOBIN A1C
HEMOGLOBIN A1C: 6.7 % — AB (ref 4.8–5.6)
Mean Plasma Glucose: 145.59 mg/dL

## 2017-05-25 MED ORDER — BUDESONIDE 0.5 MG/2ML IN SUSP
0.5000 mg | Freq: Two times a day (BID) | RESPIRATORY_TRACT | Status: DC
  Administered 2017-05-25 – 2017-05-27 (×5): 0.5 mg via RESPIRATORY_TRACT
  Filled 2017-05-25 (×5): qty 2

## 2017-05-25 MED ORDER — IPRATROPIUM-ALBUTEROL 0.5-2.5 (3) MG/3ML IN SOLN
3.0000 mL | Freq: Four times a day (QID) | RESPIRATORY_TRACT | Status: DC
  Administered 2017-05-25 (×2): 3 mL via RESPIRATORY_TRACT
  Filled 2017-05-25 (×2): qty 3

## 2017-05-25 NOTE — Care Management Obs Status (Addendum)
MEDICARE OBSERVATION STATUS NOTIFICATION   Patient Details  Name: Dana Holloway MRN: [id number] Date of Birth: Sep 03, 1934   Medicare Observation Status Notification Given:  No Patient is not able to verbalize understanding of notice.   Patient now admitted < 24 hours of observation  Eber Hong, RN 05/25/2017, 12:44 PM

## 2017-05-25 NOTE — Progress Notes (Signed)
Admission completed using interpreter on a stick Adalberto Ill). Family at bedside. Patient and family verbalized understanding of plan of care.

## 2017-05-25 NOTE — Progress Notes (Signed)
PT Cancellation Note  Patient Details Name: Dana Holloway MRN: [id number] DOB: 1934-04-24   Cancelled Treatment:    Reason Eval/Treat Not Completed: Medical issues which prohibited therapy. Chart reviewed. Pt diagnosed with PE and anticoagulation initiated on 05/24/17 at 2036. Per department protocol pt will be held for 48 hours after initiation of anticoagulation. PT evaluation will be performed after 05/26/17 2036.   Sharalyn Ink Kylar Leonhardt PT, DPT   Janifer Gieselman 05/25/2017, 9:35 AM

## 2017-05-25 NOTE — Progress Notes (Signed)
*  PRELIMINARY RESULTS* Echocardiogram 2D Echocardiogram has been performed.  Cristela Blue 05/25/2017, 2:30 PM

## 2017-05-25 NOTE — Care Management (Addendum)
Patient presents from home.  Placed in observation for shortness of breath and found to have oxygen saturation of 80% requiring supplemental oxygen - which is acute- and currently requiring a nonrebreather mask. Diagnosed with pulmonary embolus and anticipating will discharge on Eliquis.  Requesting interpreter to assess patient's pharmacy coverage. May have to delay assessment as patient very short of breath and no family members present

## 2017-05-25 NOTE — Care Management (Signed)
Spoke with patient in the presence of interpretor.  She is being placed on 6 liters oxygen nasal cannula.  Patient says she has medicare/medicaid and her daughter  has copies of the insurance cards.  She uses the pharmacy at [facility] or [address].

## 2017-05-25 NOTE — Progress Notes (Signed)
Patient ID: Dana Holloway, female   DOB: 07/11/1934, 83 y.o.   MRN: [id number]  [facility] PROGRESS NOTE  Bentley Haralson FAO:[id number] DOB: [birth year] DOA: 05/24/2017 PCP: [facility] [employer]  HPI/Subjective: Patient on 55% Ventimask. Patient complains of some shortness of breath. No chest pain. She complains of some pain in the knee. Patient states that she was not on any injection blood thinner after her knee surgery.  Objective: Vitals:   05/25/17 1135 05/25/17 1230  BP: (!) 113/54   Pulse: 82   Resp: 20   Temp: 97.7 F (36.5 C)   SpO2: 96% 100%    Filed Weights   05/24/17 1638 05/24/17 2258 05/25/17 0439  Weight: 77.1 kg (170 lb) 82.2 kg (181 lb 3.2 oz) 81.6 kg (180 lb)    ROS: Review of Systems  Constitutional: Negative for chills and fever.  Eyes: Negative for blurred vision.  Respiratory: Positive for shortness of breath. Negative for cough.   Cardiovascular: Negative for chest pain.  Gastrointestinal: Negative for abdominal pain, constipation, diarrhea, nausea and vomiting.  Genitourinary: Negative for dysuria.  Musculoskeletal: Positive for joint pain.  Neurological: Negative for dizziness and headaches.   Exam: Physical Exam  HENT:  Nose: No mucosal edema.  Mouth/Throat: No oropharyngeal exudate or posterior oropharyngeal edema.  Eyes: Pupils are equal, round, and reactive to light. Conjunctivae, EOM and lids are normal.  Neck: No JVD present. Carotid bruit is not present. No edema present. No thyroid mass and no thyromegaly present.  Cardiovascular: S1 normal and S2 normal.  Exam reveals no gallop.   No murmur heard. Pulses:      Dorsalis pedis pulses are 2+ on the right side, and 2+ on the left side.  Respiratory: No respiratory distress. She has decreased breath sounds in the right lower field and the left lower field. She has no wheezes. She has no rhonchi. She has no rales.  GI: Soft. Bowel sounds are normal. There is  no tenderness.  Musculoskeletal:       Right knee: She exhibits swelling.  Lymphadenopathy:    She has no cervical adenopathy.  Neurological: She is alert. No cranial nerve deficit.  Skin: Skin is warm. No rash noted. Nails show no clubbing.  Psychiatric: She has a normal mood and affect.      Data Reviewed: Basic Metabolic Panel:  Recent Labs Lab 05/24/17 1712  NA 137  K 4.2  CL 98*  CO2 31  GLUCOSE 112*  BUN 14  CREATININE 0.69  CALCIUM 8.9  MG 2.0   Liver Function Tests:  Recent Labs Lab 05/24/17 1712  AST 25  ALT 19  ALKPHOS 91  BILITOT 0.6  PROT 7.2  ALBUMIN 3.5   CBC:  Recent Labs Lab 05/24/17 1712  WBC 9.7  NEUTROABS 7.2*  HGB 10.3*  HCT 31.8*  MCV 81.9  PLT 405   Cardiac Enzymes:  Recent Labs Lab 05/24/17 1712  TROPONINI <0.03   BNP (last 3 results)  Recent Labs  05/24/17 1712  BNP 272.0*    CBG:  Recent Labs Lab 05/24/17 2301 05/25/17 0717 05/25/17 1134  GLUCAP 160* 168* 153*    Recent Results (from the past 240 hour(s))  Blood Culture (routine x 2)     Status: None (Preliminary result)   Collection Time: 05/24/17  5:13 PM  Result Value Ref Range Status   Specimen Description BLOOD LEFT ANTECUBITAL  Final   Special Requests   Final    BOTTLES  DRAWN AEROBIC AND ANAEROBIC Blood Culture results may not be optimal due to an excessive volume of blood received in culture bottles   Culture NO GROWTH < 24 HOURS  Final   Report Status PENDING  Incomplete  Blood Culture (routine x 2)     Status: None (Preliminary result)   Collection Time: 05/24/17  5:13 PM  Result Value Ref Range Status   Specimen Description BLOOD BLOOD LEFT HAND  Final   Special Requests   Final    BOTTLES DRAWN AEROBIC AND ANAEROBIC Blood Culture adequate volume   Culture NO GROWTH < 24 HOURS  Final   Report Status PENDING  Incomplete     Studies: Ct Angio Chest Pe W And/or Wo Contrast  Result Date: 05/24/2017 CLINICAL DATA:  Shortness of breath and  nausea. EXAM: CT ANGIOGRAPHY CHEST WITH CONTRAST TECHNIQUE: Multidetector CT imaging of the chest was performed using the standard protocol during bolus administration of intravenous contrast. Multiplanar CT image reconstructions and MIPs were obtained to evaluate the vascular anatomy. CONTRAST:  75 cc Isovue 370 intravenously. COMPARISON:  08/24/2015 FINDINGS: Cardiovascular: Satisfactory opacification of the pulmonary arteries to the segmental level. There are lobar and segmental pulmonary emboli within the right upper lobe. No pericardial effusion. Calcific atherosclerotic disease of the coronary arteries. LAD stent. No evidence of right heart strain. Mediastinum/Nodes: Shotty mediastinal lymph nodes, the largest measuring 8 mm in short axis. Lungs/Pleura: Lungs are clear. No pleural effusion or pneumothorax. Upper Abdomen: No acute abnormality. Musculoskeletal: Stable compression deformity of T9 vertebral body. Multilevel osteoarthritic changes. Focal asymmetry in the lower outer quadrant of the right breast, image 43/93. Review of the MIP images confirms the above findings. IMPRESSION: Lobar and segmental partially occlusive pulmonary emboli within the right upper lobe pulmonary arteries. No evidence of right heart strain. Focal asymmetry in the lower outer quadrant of the right breast. Please correlate to mammography and focused right breast ultrasound. These results were called by telephone at the time of interpretation on 05/24/2017 at 7:09 pm to Dr. Willy Eddy , who verbally acknowledged these results. Electronically Signed   By: Ted Mcalpine M.D.   On: 05/24/2017 19:12   US Venous Img Lower Unilateral Right  Result Date: 05/25/2017 CLINICAL DATA:  Right leg swelling. EXAM: RIGHT LOWER EXTREMITY VENOUS DOPPLER ULTRASOUND TECHNIQUE: Gray-scale sonography with graded compression, as well as color Doppler and duplex ultrasound were performed to evaluate the lower extremity deep venous systems  from the level of the common femoral vein and including the common femoral, femoral, profunda femoral, popliteal and calf veins including the posterior tibial, peroneal and gastrocnemius veins when visible. The superficial great saphenous vein was also interrogated. Spectral Doppler was utilized to evaluate flow at rest and with distal augmentation maneuvers in the common femoral, femoral and popliteal veins. COMPARISON:  None. FINDINGS: Contralateral Common Femoral Vein: Respiratory phasicity is normal and symmetric with the symptomatic side. No evidence of thrombus. Normal compressibility. Common Femoral Vein: No evidence of thrombus. Normal compressibility, respiratory phasicity and response to augmentation. Saphenofemoral Junction: No evidence of thrombus. Normal compressibility and flow on color Doppler imaging. Profunda Femoral Vein: No evidence of thrombus. Normal compressibility and flow on color Doppler imaging. Femoral Vein: No evidence of thrombus. Normal compressibility, respiratory phasicity and response to augmentation. Popliteal Vein: No evidence of thrombus. Normal compressibility, respiratory phasicity and response to augmentation. Calf Veins: No evidence of thrombus. Normal compressibility and flow on color Doppler imaging. Superficial Great Saphenous Vein: No evidence of thrombus. Normal compressibility  and flow on color Doppler imaging. Venous Reflux:  None. Other Findings:  None. IMPRESSION: No evidence of DVT within the right lower extremity. Electronically Signed   By: Obie Dredge M.D.   On: 05/25/2017 09:11   Dg Chest Port 1 View  Result Date: 05/24/2017 CLINICAL DATA:  Low oxygen levels. EXAM: PORTABLE CHEST 1 VIEW COMPARISON:  May 02, 2016 FINDINGS: Cardiomegaly. The hila are poorly evaluated due to technique. The mediastinum is unchanged. No nodules or masses. No focal infiltrates. IMPRESSION: Limited study due to the low volume portable technique with no acute abnormalities  identified. Electronically Signed   By: Gerome Sam III M.D   On: 05/24/2017 17:17    Scheduled Meds: . apixaban  10 mg Oral BID  . [START ON 06/01/2017] apixaban  5 mg Oral BID  . aspirin EC  81 mg Oral Daily  . budesonide (PULMICORT) nebulizer solution  0.5 mg Nebulization BID  . citalopram  10 mg Oral Daily  . clopidogrel  75 mg Oral Daily  . docusate sodium  100 mg Oral BID  . famotidine  20 mg Oral QHS  . insulin aspart  0-15 Units Subcutaneous TID WC  . insulin aspart  0-5 Units Subcutaneous QHS  . insulin glargine  34 Units Subcutaneous Daily  . ipratropium-albuterol  3 mL Nebulization Q6H  . meloxicam  7.5 mg Oral Daily  . metFORMIN  500 mg Oral Q breakfast  . metoprolol succinate  50 mg Oral Daily  . pregabalin  50 mg Oral BID  . QUEtiapine  25 mg Oral QHS    Assessment/Plan:  1. Acute hypoxic respiratory failure. Patient was on 55% Ventimask. I asked the nurse to taper this down because she is 100% on the 55% Ventimask. I spoke with respiratory rate taper the patient down to 4 L. Check pulse ox on room air in the morning. I started nebulizer treatments. Incentive spirometer. 2. Acute pulmonary embolism. Patient started on Eliquis. Benefits and risks of blood thinner explained to the patient. Her risk for pulmonary embolism was right total knee replacement. She stated she was not on any injectable blood thinners at home after the procedure. 3. Insulin-dependent diabetes mellitus on Lantus and sliding scale 4. Essential hypertension. Continue metoprolol but losartan held 5. Recent right knee surgery. Physical therapy evaluation  Code Status:     Code Status Orders        Start     Ordered   05/24/17 2007  Full code  Continuous     05/24/17 2008    Code Status History    Date Active Date Inactive Code Status Order ID Comments User Context   11/15/2015 10:55 PM 11/17/2015  3:27 PM Full Code [id number]  Ramonita Lab, MD Inpatient   08/24/2015  6:15 AM 08/25/2015   4:11 PM Full Code [id number]  Arnaldo Natal, MD Inpatient     Disposition Plan: Home once off oxygen  Time spent: 30 minutes in coordination of care. Translator provided by the hospital in order to communicate with the patient  Alford Highland  [employer]

## 2017-05-26 DIAGNOSIS — J9601 Acute respiratory failure with hypoxia: Secondary | ICD-10-CM | POA: Diagnosis not present

## 2017-05-26 DIAGNOSIS — I2699 Other pulmonary embolism without acute cor pulmonale: Secondary | ICD-10-CM | POA: Diagnosis not present

## 2017-05-26 LAB — BASIC METABOLIC PANEL
ANION GAP: 5 (ref 5–15)
BUN: 18 mg/dL (ref 6–20)
CHLORIDE: 102 mmol/L (ref 101–111)
CO2: 33 mmol/L — AB (ref 22–32)
Calcium: 9 mg/dL (ref 8.9–10.3)
Creatinine, Ser: 0.66 mg/dL (ref 0.44–1.00)
GFR calc non Af Amer: >60 mL/min (ref >60)
GLUCOSE: 171 mg/dL — AB (ref 65–99)
POTASSIUM: 4.2 mmol/L (ref 3.5–5.1)
Sodium: 140 mmol/L (ref 135–145)

## 2017-05-26 LAB — GLUCOSE, CAPILLARY
GLUCOSE-CAPILLARY: 129 mg/dL — AB (ref 65–99)
GLUCOSE-CAPILLARY: 135 mg/dL — AB (ref 65–99)
GLUCOSE-CAPILLARY: 195 mg/dL — AB (ref 65–99)
GLUCOSE-CAPILLARY: 266 mg/dL — AB (ref 65–99)

## 2017-05-26 LAB — HEMOGLOBIN A1C
Hgb A1c MFr Bld: 6.8 % — ABNORMAL HIGH (ref 4.8–5.6)
MEAN PLASMA GLUCOSE: 148.46 mg/dL

## 2017-05-26 MED ORDER — METHYLPREDNISOLONE SODIUM SUCC 40 MG IJ SOLR
40.0000 mg | Freq: Every day | INTRAMUSCULAR | Status: DC
  Administered 2017-05-26 – 2017-05-27 (×2): 40 mg via INTRAVENOUS
  Filled 2017-05-26 (×2): qty 1

## 2017-05-26 MED ORDER — IPRATROPIUM-ALBUTEROL 0.5-2.5 (3) MG/3ML IN SOLN
3.0000 mL | Freq: Three times a day (TID) | RESPIRATORY_TRACT | Status: DC
  Administered 2017-05-26 – 2017-05-27 (×4): 3 mL via RESPIRATORY_TRACT
  Filled 2017-05-26 (×5): qty 3

## 2017-05-26 NOTE — Care Management (Signed)
It is entirely possible that this patient is gong to require home oxygen. Discussed the need to document qualifying sats . Physical therapy to evaluate today.

## 2017-05-26 NOTE — Progress Notes (Signed)
Patient ID: Dana Holloway, female   DOB: Apr 18, 1934, 83 y.o.   MRN: [id number]   [facility] PROGRESS NOTE  Dana Holloway OIT:[id number] DOB: 08-Apr-1934 DOA: 05/24/2017 PCP: [facility] [employer]  HPI/Subjective: Patient still requiring oxygen. She states she short of breath. Some cough. Some wheeze.  Objective: Vitals:   05/26/17 1131 05/26/17 1307  BP: (!) 120/99   Pulse: 89   Resp: 18   Temp: 98.5 F (36.9 C)   SpO2: 92% 95%    Filed Weights   05/24/17 2258 05/25/17 0439 05/26/17 0508  Weight: 82.2 kg (181 lb 3.2 oz) 81.6 kg (180 lb) 78.9 kg (174 lb)    ROS: Review of Systems  Constitutional: Negative for chills and fever.  Eyes: Negative for blurred vision.  Respiratory: Positive for shortness of breath. Negative for cough.   Cardiovascular: Negative for chest pain.  Gastrointestinal: Negative for abdominal pain, constipation, diarrhea, nausea and vomiting.  Genitourinary: Negative for dysuria.  Musculoskeletal: Positive for joint pain.  Neurological: Negative for dizziness and headaches.   Exam: Physical Exam  HENT:  Nose: No mucosal edema.  Mouth/Throat: No oropharyngeal exudate or posterior oropharyngeal edema.  Eyes: Pupils are equal, round, and reactive to light. Conjunctivae, EOM and lids are normal.  Neck: No JVD present. Carotid bruit is not present. No edema present. No thyroid mass and no thyromegaly present.  Cardiovascular: S1 normal and S2 normal.  Exam reveals no gallop.   No murmur heard. Pulses:      Dorsalis pedis pulses are 2+ on the right side, and 2+ on the left side.  Respiratory: No respiratory distress. She has decreased breath sounds in the right lower field and the left lower field. She has wheezes in the right middle field, the right lower field, the left middle field and the left lower field. She has no rhonchi. She has no rales.  GI: Soft. Bowel sounds are normal. There is no tenderness.   Musculoskeletal:       Right knee: She exhibits swelling.  Lymphadenopathy:    She has no cervical adenopathy.  Neurological: She is alert. No cranial nerve deficit.  Skin: Skin is warm. No rash noted. Nails show no clubbing.  Psychiatric: She has a normal mood and affect.      Data Reviewed: Basic Metabolic Panel:  Recent Labs Lab 05/24/17 1712 05/26/17 0431  NA 137 140  K 4.2 4.2  CL 98* 102  CO2 31 33*  GLUCOSE 112* 171*  BUN 14 18  CREATININE 0.69 0.66  CALCIUM 8.9 9.0  MG 2.0  --    Liver Function Tests:  Recent Labs Lab 05/24/17 1712  AST 25  ALT 19  ALKPHOS 91  BILITOT 0.6  PROT 7.2  ALBUMIN 3.5   CBC:  Recent Labs Lab 05/24/17 1712  WBC 9.7  NEUTROABS 7.2*  HGB 10.3*  HCT 31.8*  MCV 81.9  PLT 405   Cardiac Enzymes:  Recent Labs Lab 05/24/17 1712  TROPONINI <0.03   BNP (last 3 results)  Recent Labs  05/24/17 1712  BNP 272.0*    CBG:  Recent Labs Lab 05/25/17 1134 05/25/17 1611 05/25/17 2044 05/26/17 0747 05/26/17 1133  GLUCAP 153* 129* 152* 129* 135*    Recent Results (from the past 240 hour(s))  Blood Culture (routine x 2)     Status: None (Preliminary result)   Collection Time: 05/24/17  5:13 PM  Result Value Ref Range Status   Specimen Description BLOOD LEFT  ANTECUBITAL  Final   Special Requests   Final    BOTTLES DRAWN AEROBIC AND ANAEROBIC Blood Culture results may not be optimal due to an excessive volume of blood received in culture bottles   Culture NO GROWTH 2 DAYS  Final   Report Status PENDING  Incomplete  Blood Culture (routine x 2)     Status: None (Preliminary result)   Collection Time: 05/24/17  5:13 PM  Result Value Ref Range Status   Specimen Description BLOOD BLOOD LEFT HAND  Final   Special Requests   Final    BOTTLES DRAWN AEROBIC AND ANAEROBIC Blood Culture adequate volume   Culture NO GROWTH 2 DAYS  Final   Report Status PENDING  Incomplete     Studies: Ct Angio Chest Pe W And/or Wo  Contrast  Result Date: 05/24/2017 CLINICAL DATA:  Shortness of breath and nausea. EXAM: CT ANGIOGRAPHY CHEST WITH CONTRAST TECHNIQUE: Multidetector CT imaging of the chest was performed using the standard protocol during bolus administration of intravenous contrast. Multiplanar CT image reconstructions and MIPs were obtained to evaluate the vascular anatomy. CONTRAST:  75 cc Isovue 370 intravenously. COMPARISON:  08/24/2015 FINDINGS: Cardiovascular: Satisfactory opacification of the pulmonary arteries to the segmental level. There are lobar and segmental pulmonary emboli within the right upper lobe. No pericardial effusion. Calcific atherosclerotic disease of the coronary arteries. LAD stent. No evidence of right heart strain. Mediastinum/Nodes: Shotty mediastinal lymph nodes, the largest measuring 8 mm in short axis. Lungs/Pleura: Lungs are clear. No pleural effusion or pneumothorax. Upper Abdomen: No acute abnormality. Musculoskeletal: Stable compression deformity of T9 vertebral body. Multilevel osteoarthritic changes. Focal asymmetry in the lower outer quadrant of the right breast, image 43/93. Review of the MIP images confirms the above findings. IMPRESSION: Lobar and segmental partially occlusive pulmonary emboli within the right upper lobe pulmonary arteries. No evidence of right heart strain. Focal asymmetry in the lower outer quadrant of the right breast. Please correlate to mammography and focused right breast ultrasound. These results were called by telephone at the time of interpretation on 05/24/2017 at 7:09 pm to Dr. Willy Eddy , who verbally acknowledged these results. Electronically Signed   By: Ted Mcalpine M.D.   On: 05/24/2017 19:12   US Venous Img Lower Unilateral Right  Result Date: 05/25/2017 CLINICAL DATA:  Right leg swelling. EXAM: RIGHT LOWER EXTREMITY VENOUS DOPPLER ULTRASOUND TECHNIQUE: Gray-scale sonography with graded compression, as well as color Doppler and duplex  ultrasound were performed to evaluate the lower extremity deep venous systems from the level of the common femoral vein and including the common femoral, femoral, profunda femoral, popliteal and calf veins including the posterior tibial, peroneal and gastrocnemius veins when visible. The superficial great saphenous vein was also interrogated. Spectral Doppler was utilized to evaluate flow at rest and with distal augmentation maneuvers in the common femoral, femoral and popliteal veins. COMPARISON:  None. FINDINGS: Contralateral Common Femoral Vein: Respiratory phasicity is normal and symmetric with the symptomatic side. No evidence of thrombus. Normal compressibility. Common Femoral Vein: No evidence of thrombus. Normal compressibility, respiratory phasicity and response to augmentation. Saphenofemoral Junction: No evidence of thrombus. Normal compressibility and flow on color Doppler imaging. Profunda Femoral Vein: No evidence of thrombus. Normal compressibility and flow on color Doppler imaging. Femoral Vein: No evidence of thrombus. Normal compressibility, respiratory phasicity and response to augmentation. Popliteal Vein: No evidence of thrombus. Normal compressibility, respiratory phasicity and response to augmentation. Calf Veins: No evidence of thrombus. Normal compressibility and flow on color  Doppler imaging. Superficial Great Saphenous Vein: No evidence of thrombus. Normal compressibility and flow on color Doppler imaging. Venous Reflux:  None. Other Findings:  None. IMPRESSION: No evidence of DVT within the right lower extremity. Electronically Signed   By: Obie Dredge M.D.   On: 05/25/2017 09:11   Dg Chest Port 1 View  Result Date: 05/24/2017 CLINICAL DATA:  Low oxygen levels. EXAM: PORTABLE CHEST 1 VIEW COMPARISON:  May 02, 2016 FINDINGS: Cardiomegaly. The hila are poorly evaluated due to technique. The mediastinum is unchanged. No nodules or masses. No focal infiltrates. IMPRESSION: Limited  study due to the low volume portable technique with no acute abnormalities identified. Electronically Signed   By: Gerome Sam III M.D   On: 05/24/2017 17:17    Scheduled Meds: . apixaban  10 mg Oral BID  . [START ON 06/01/2017] apixaban  5 mg Oral BID  . aspirin EC  81 mg Oral Daily  . budesonide (PULMICORT) nebulizer solution  0.5 mg Nebulization BID  . citalopram  10 mg Oral Daily  . clopidogrel  75 mg Oral Daily  . docusate sodium  100 mg Oral BID  . famotidine  20 mg Oral QHS  . insulin aspart  0-15 Units Subcutaneous TID WC  . insulin aspart  0-5 Units Subcutaneous QHS  . insulin glargine  34 Units Subcutaneous Daily  . ipratropium-albuterol  3 mL Nebulization TID  . meloxicam  7.5 mg Oral Daily  . metFORMIN  500 mg Oral Q breakfast  . methylPREDNISolone (SOLU-MEDROL) injection  40 mg Intravenous Daily  . metoprolol succinate  50 mg Oral Daily  . pregabalin  50 mg Oral BID  . QUEtiapine  25 mg Oral QHS    Assessment/Plan:  1. Acute hypoxic respiratory failure. Patient Down to 2 L nasal cannula. Pulse ox 88% on room air. Once the patient is wheezing I started systemic steroids Solu-Medrol 40 mg daily. Continue nebulizer treatments. Incentive spirometer. 2. Acute pulmonary embolism. Patient started on Eliquis. Benefits and risks of blood thinner explained to the patient. Her risk for pulmonary embolism was right total knee replacement. She stated she was not on any injectable blood thinners at home after the procedure. 3. Insulin-dependent diabetes mellitus on Lantus and sliding scale 4. Essential hypertension. Continue metoprolol but losartan held 5. Recent right knee surgery. Physical therapy evaluation 6. History of coronary artery disease. Since the patient is now on Eliquis, I will hold Plavix and continue aspirin at this time. Patient on metoprolol.  Code Status:     Code Status Orders        Start     Ordered   05/24/17 2007  Full code  Continuous     05/24/17  2008    Code Status History    Date Active Date Inactive Code Status Order ID Comments User Context   11/15/2015 10:55 PM 11/17/2015  3:27 PM Full Code [id number]  Ramonita Lab, MD Inpatient   08/24/2015  6:15 AM 08/25/2015  4:11 PM Full Code [id number]  Arnaldo Natal, MD Inpatient     Disposition Plan: Home Once breathing better. Hopefully we'll get off oxygen prior to disposition  Time spent: 28 minutes. Translator provided by the hospital in order to communicate with the patient. Daughter at the bedside.  Alford Highland  [employer]

## 2017-05-27 DIAGNOSIS — J9601 Acute respiratory failure with hypoxia: Secondary | ICD-10-CM | POA: Diagnosis not present

## 2017-05-27 DIAGNOSIS — I2699 Other pulmonary embolism without acute cor pulmonale: Secondary | ICD-10-CM | POA: Diagnosis not present

## 2017-05-27 LAB — GLUCOSE, CAPILLARY
Glucose-Capillary: 143 mg/dL — ABNORMAL HIGH (ref 65–99)
Glucose-Capillary: 168 mg/dL — ABNORMAL HIGH (ref 65–99)

## 2017-05-27 MED ORDER — PREDNISONE 10 MG PO TABS: ORAL_TABLET | ORAL | 0 refills | Status: DC

## 2017-05-27 MED ORDER — APIXABAN 5 MG PO TABS: ORAL_TABLET | ORAL | 0 refills | Status: DC

## 2017-05-27 MED ORDER — INSULIN GLARGINE 100 UNIT/ML ~~LOC~~ SOLN: 40.0000 [IU] | Freq: Every day | SUBCUTANEOUS | 11 refills | Status: DC

## 2017-05-27 NOTE — Care Management (Signed)
Patient has qualified for home oxygen.  She remained hypoxic despite nebulizer and IV steroids.  Spoke with patient and her daughter through an interpretor.  No agency preference for oxygen.  Referral called to [facility]   Informed that patient's medicaid pays for her medications. Patient to discharge home on Eliquis. It is on the medicaid preferred list and no prior authorization is required. Discussed that [facility] is aware of discharge and will resume services.

## 2017-05-27 NOTE — Care Management (Signed)
Patient is currently open to [facility] PT OT.  Updated agency

## 2017-05-27 NOTE — Discharge Summary (Signed)
[facility] - [place] [facility]   PATIENT NAME: Dana Holloway    MR#:  [id number]  DATE OF BIRTH:  [birth year]  DATE OF ADMISSION:  05/24/2017 ADMITTING PHYSICIAN: Milagros Loll, MD  DATE OF DISCHARGE: 05/27/2017  PRIMARY CARE PHYSICIAN: [facility]    ADMISSION DIAGNOSIS:  Acute respiratory failure with hypoxia (HCC) [J96.01] Right leg swelling [M79.89] Other acute pulmonary embolism without acute cor pulmonale (HCC) [I26.99]  DISCHARGE DIAGNOSIS:  Active Problems:   Pulmonary embolism (HCC)   SECONDARY DIAGNOSIS:   Past Medical History:  Diagnosis Date  . Arthritis   . Asthma   . Coronary artery disease   . Diabetes mellitus without complication (HCC)   . Gastritis   . Hypertension   . Orbital osteo-periostitis     HOSPITAL COURSE:   1. Acute on chronic hypoxic respiratory failure.  The patient was weaned down on her oxygen requirements from 100% nonrebreather down to 55% Ventimask down to 4 L and then down to 2 L nasal cannula. I thought I was going to be able to take the patient off oxygen completely but the nursing staff stated she went down to 83% at rest. She was set up with home oxygen. 2. Acute pulmonary embolism.  Patient was started on Eliquis. Benefits and risks of blood thinner explained to the patient and her daughter. I stopped her aspirin and Plavix. Her risk of pulmonary embolism was right total knee replacement and not on any injectable blood thinners at home after the procedure. Recommend a repeat CT scan of the chest in 3-6 months to see if she can come off the blood thinner. 3. Insulin-dependent diabetes mellitus on Lantus and sliding scale 4. COPD exacerbation. Patient was started on Solu-Medrol secondary to wheeze. Give a few more days of prednisone. Continue inhalers 5. Essential hypertension continue metoprolol and can restart losartan as outpatient 6. Recent right knee surgery. Continue home health 7.  History of coronary artery disease. Considering the patient is on Eliquis I will hold Plavix and aspirin at this time. Continue metoprolol and atorvastatin  DISCHARGE CONDITIONS:   Satisfactory  CONSULTS OBTAINED:   none  DRUG ALLERGIES:   Allergies  Allergen Reactions  . Bactrim [Sulfamethoxazole-Trimethoprim] Hives  . Sulfa Antibiotics Hives    DISCHARGE MEDICATIONS:   Current Discharge Medication List    START taking these medications   Details  apixaban (ELIQUIS) 5 MG TABS tablet 2 tablets twice a day for 4 days then one tablet twice a day afterwards Qty: 64 tablet, Refills: 0      CONTINUE these medications which have CHANGED   Details  insulin glargine (LANTUS) 100 UNIT/ML injection Inject 0.4 mLs (40 Units total) into the skin daily. 42 units in the morning and 35 units at night Qty: 10 mL, Refills: 11    predniSONE (DELTASONE) 10 MG tablet 4 tablets twice a day for 3 days Qty: 12 tablet, Refills: 0      CONTINUE these medications which have NOT CHANGED   Details  albuterol (PROVENTIL HFA;VENTOLIN HFA) 108 (90 BASE) MCG/ACT inhaler Inhale 2 puffs into the lungs every 6 (six) hours as needed for wheezing or shortness of breath.    atorvastatin (LIPITOR) 10 MG tablet Take 10 mg by mouth at bedtime.    budesonide-formoterol (SYMBICORT) 80-4.5 MCG/ACT inhaler Inhale 2 puffs into the lungs 2 (two) times daily.    cholecalciferol (VITAMIN D) 1000 UNITS tablet Take 1,000 Units by mouth daily.  citalopram (CELEXA) 10 MG tablet Take 10 mg by mouth daily.    docusate sodium (COLACE) 100 MG capsule Take 2 mg by mouth 2 (two) times daily as needed for mild constipation.    losartan (COZAAR) 50 MG tablet Take 50 mg by mouth daily.     metFORMIN (GLUCOPHAGE) 500 MG tablet Take 500 mg by mouth daily with breakfast.     metoprolol succinate (TOPROL-XL) 25 MG 24 hr tablet Take 50 mg by mouth daily.    miconazole (MICOTIN) 2 % cream Apply 1 application topically 2 (two)  times daily. Apply to feet    Multiple Vitamin (MULTIVITAMIN WITH MINERALS) TABS tablet Take 1 tablet by mouth at bedtime.     nitroGLYCERIN (NITROSTAT) 0.4 MG SL tablet Place 0.4 mg under the tongue every 5 (five) minutes as needed for chest pain.    omeprazole (PRILOSEC) 20 MG capsule Take 20 mg by mouth daily.     oxycodone (OXY-IR) 5 MG capsule Take 10 mg by mouth every 4 (four) hours as needed.    pregabalin (LYRICA) 50 MG capsule Take 50 mg by mouth 2 (two) times daily.    QUEtiapine (SEROQUEL) 25 MG tablet Take 25-50 mg by mouth at bedtime.    ranitidine (ZANTAC) 150 MG tablet Take 150 mg by mouth at bedtime.    acetaminophen (TYLENOL) 325 MG tablet Take 650 mg by mouth 3 (three) times daily as needed for mild pain or moderate pain.    diclofenac sodium (VOLTAREN) 1 % GEL Apply 1 application topically 4 (four) times daily.      STOP taking these medications     aspirin EC 81 MG tablet      clopidogrel (PLAVIX) 75 MG tablet      meloxicam (MOBIC) 7.5 MG tablet      metroNIDAZOLE (FLAGYL) 500 MG tablet      naproxen (NAPROSYN) 500 MG tablet      ondansetron (ZOFRAN ODT) 4 MG disintegrating tablet      promethazine (PHENERGAN) 12.5 MG tablet          DISCHARGE INSTRUCTIONS:   Follow-up with home health. Follow-up with PMD one week  If you experience worsening of your admission symptoms, develop shortness of breath, life threatening emergency, suicidal or homicidal thoughts you must seek medical attention immediately by calling 911 or calling your MD immediately  if symptoms less severe.  You Must read complete instructions/literature along with all the possible adverse reactions/side effects for all the Medicines you take and that have been prescribed to you. Take any new Medicines after you have completely understood and accept all the possible adverse reactions/side effects.   Please note  You were cared for by a hospitalist during your hospital stay. If you  have any questions about your discharge medications or the care you received while you were in the hospital after you are discharged, you can call the unit and asked to speak with the hospitalist on call if the hospitalist that took care of you is not available. Once you are discharged, your primary care physician will handle any further medical issues. Please note that NO REFILLS for any discharge medications will be authorized once you are discharged, as it is imperative that you return to your primary care physician (or establish a relationship with a primary care physician if you do not have one) for your aftercare needs so that they can reassess your need for medications and monitor your lab values.    Today  CHIEF COMPLAINT:   Chief Complaint  Patient presents with  . Shortness of Breath    HISTORY OF PRESENT ILLNESS:  Dana Holloway  is a 83 y.o. female presented with shortness of breath and found to have pulmonary embolism   VITAL SIGNS:  Blood pressure (!) 130/56, pulse 83, temperature 97.8 F (36.6 C), temperature source Oral, resp. rate 18, height 5' (1.524 m), weight 77.6 kg (171 lb), SpO2 95 %.    PHYSICAL EXAMINATION:  GENERAL:  83 y.o.-year-old patient lying in the bed with no acute distress.  EYES: Pupils equal, round, reactive to light and accommodation. No scleral icterus. Extraocular muscles intact.  HEENT: Head atraumatic, normocephalic. Oropharynx and nasopharynx clear.  NECK:  Supple, no jugular venous distention. No thyroid enlargement, no tenderness.  LUNGS: Equal breath sounds bilaterally, better breath sounds today than yesterday. No wheezing, rales,rhonchi or crepitation. No use of accessory muscles of respiration.  CARDIOVASCULAR: S1, S2 normal. No murmurs, rubs, or gallops.  ABDOMEN: Soft, non-tender, non-distended. Bowel sounds present. No organomegaly or mass.  EXTREMITIES: Trace edema, no cyanosis, or clubbing.  NEUROLOGIC: Cranial nerves II  through XII are intact. Muscle strength 5/5 in all extremities. Sensation intact. Gait not checked.  PSYCHIATRIC: The patient is alert and oriented x 3.  SKIN: No obvious rash, lesion, or ulcer.   DATA REVIEW:   CBC  Recent Labs Lab 05/24/17 1712  WBC 9.7  HGB 10.3*  HCT 31.8*  PLT 405    Chemistries   Recent Labs Lab 05/24/17 1712 05/26/17 0431  NA 137 140  K 4.2 4.2  CL 98* 102  CO2 31 33*  GLUCOSE 112* 171*  BUN 14 18  CREATININE 0.69 0.66  CALCIUM 8.9 9.0  MG 2.0  --   AST 25  --   ALT 19  --   ALKPHOS 91  --   BILITOT 0.6  --     Cardiac Enzymes  Recent Labs Lab 05/24/17 1712  TROPONINI <0.03    Microbiology Results  Results for orders placed or performed during the hospital encounter of 05/24/17  Blood Culture (routine x 2)     Status: None (Preliminary result)   Collection Time: 05/24/17  5:13 PM  Result Value Ref Range Status   Specimen Description BLOOD LEFT ANTECUBITAL  Final   Special Requests   Final    BOTTLES DRAWN AEROBIC AND ANAEROBIC Blood Culture results may not be optimal due to an excessive volume of blood received in culture bottles   Culture NO GROWTH 3 DAYS  Final   Report Status PENDING  Incomplete  Blood Culture (routine x 2)     Status: None (Preliminary result)   Collection Time: 05/24/17  5:13 PM  Result Value Ref Range Status   Specimen Description BLOOD BLOOD LEFT HAND  Final   Special Requests   Final    BOTTLES DRAWN AEROBIC AND ANAEROBIC Blood Culture adequate volume   Culture NO GROWTH 3 DAYS  Final   Report Status PENDING  Incomplete      Management plans discussed with the patient, family and they are in agreement.  CODE STATUS:     Code Status Orders        Start     Ordered   05/24/17 2007  Full code  Continuous     05/24/17 2008    Code Status History    Date Active Date Inactive Code Status Order ID Comments User Context   11/15/2015 10:55 PM 11/17/2015  3:27 PM Full Code [id number]  Ramonita Lab,  MD Inpatient   08/24/2015  6:15 AM 08/25/2015  4:11 PM Full Code [id number]  Arnaldo Natal, MD Inpatient      TOTAL TIME TAKING CARE OF THIS PATIENT: 35 minutes.    Alford Highland M.D on 05/27/2017 at 2:52 PM  Between 7am to 6pm - Pager - [phone]  After 6pm go to [email or web address] - password [email or web address]  [facility] Office  [phone]  CC: Primary care physician; [facility] [employer]

## 2017-05-27 NOTE — Care Management Important Message (Signed)
Important Message  Patient Details  Name: Dana Holloway MRN: [id number] Date of Birth: 12-24-1933   Medicare Important Message Given:  Yes  Provided IM in spanish  Eber Hong, RN 05/27/2017, 10:10 AM

## 2017-05-27 NOTE — Evaluation (Signed)
Physical Therapy Evaluation Patient Details Name: Dana Holloway MRN: [id number] DOB: Jan 10, 1934 Today's Date: 05/27/2017   History of Present Illness  Dana Holloway  is a 83 y.o. female with a known history of CAD, hypertension, diabetes, COPD, recent right total knee arthroplasty at [facility] on 05/13/17 presented to the emergency room complaining of right leg swelling and shortness of breath. Patient has had some sputum. Saturations 80% on room air and on 3 L oxygen. CT scan of the chest has shown a right lobar artery PE. No signs of right heart strain. Patient admitted now due to acute pulmonary embolism and acute hypoxic respiratory failure. PT evaluation was held for 48 hours after the initiation of anticoagulation medication in accordance with hospital policy.   Clinical Impression  Pt admitted with above diagnosis. Pt currently with functional limitations due to the deficits listed below (see PT Problem List).  Pt is modified independent for bed mobility and supervision only for transfers with cues for safe hand placement. She is able to ambulate 150' with therapist with rolling walker. Pt demonstrates symmetrical step length and good weight acceptance to RLE. Rolling walker utilized and no external support required for balance. Pt demonstrates good safety awareness. She does become fatigued with ambulation. SaO2 monitored and is 90% on room air at rest. Decreases to 88% with exertion but with standing rest break and deep breathing SaO2 recovers to 93% on room air without need for supplemental O2. She is able to ambulate with therapist back to room and maintain SaO2>90%. Pt reports history of DOE within the last 6 months. Encouraged to discuss this with the hospitalist. Pt should resume HH PT for post-op care secondary to R TKR. Pt will benefit from PT services to address deficits in strength, balance, and mobility in order to return to full function at home.     Follow Up  Recommendations Home health PT;Other (comment) (Resume HH PT for R TKR post-operative care)    Equipment Recommendations  None recommended by PT    Recommendations for Other Services       Precautions / Restrictions Precautions Precautions: Fall Restrictions Weight Bearing Restrictions: No      Mobility  Bed Mobility Overal bed mobility: Modified Independent             General bed mobility comments: HOB elevated and use of bed rails  Transfers Overall transfer level: Needs assistance Equipment used: Rolling walker (2 wheeled) Transfers: Sit to/from Stand Sit to Stand: Supervision         General transfer comment: Cues for safe hand placement. Good sequencing with transfers. Good weight acceptance to RLE  Ambulation/Gait Ambulation/Gait assistance: Min guard Ambulation Distance (Feet): 150 Feet Assistive device: Rolling walker (2 wheeled) Gait Pattern/deviations: Step-through pattern Gait velocity: Decreased but functional for limited community mobility   General Gait Details: Pt demonstrates symmetrical step length and good weight acceptance to RLE. Rolling walker utilized and no external support required for balance. Pt demonstrates good safety awareness. She does become fatigued with ambulation. SaO2 monitored and is 90% on room air at rest. Decreases to 88% with exertion but with standing rest break and deep breathing SaO2 recovers to 93% on room air. She is able to ambulate with therapist back to room. Pt reports history of DOE within the last 6 months. Encouraged to discuss this with the hospitalist  Stairs            Wheelchair Mobility    Modified Rankin (Stroke Patients  Only)       Balance Overall balance assessment: Needs assistance Sitting-balance support: No upper extremity supported Sitting balance-Leahy Scale: Good     Standing balance support: No upper extremity supported Standing balance-Leahy Scale: Fair Standing balance comment:  Able to maintain standing balance without UE support                             Pertinent Vitals/Pain Pain Assessment: 0-10 Pain Score: 4  Pain Location: R knee, denies chest pain Pain Intervention(s): Monitored during session    Home Living Family/patient expects to be discharged to:: Private residence Living Arrangements: Children;Other (Comment) (daughter) Available Help at Discharge: Family Type of Home: Mobile home Home Access: Stairs to enter Entrance Stairs-Rails: Right;Left;Can reach both Entrance Stairs-Number of Steps: 4 Home Layout: One level Home Equipment: Walker - 2 wheels;Cane - single point;Shower seat      Prior Function Level of Independence: Independent with assistive device(s)         Comments: Prior to knee replacement was ambulating with single point cane and was independent with ADLs/IADLs. Using rolling walker prior to admission and daughter assisting at home with self-care. No falls in the last 12 months     Hand Dominance   Dominant Hand: Right    Extremity/Trunk Assessment   Upper Extremity Assessment Upper Extremity Assessment: Overall WFL for tasks assessed    Lower Extremity Assessment Lower Extremity Assessment: RLE deficits/detail RLE Deficits / Details: Pt reports some intermittent numbness RLE when laying on R side but relieved with positional changes. At least 4+/5 R hip flexion, knee flex/extension, and ankle dorsiflexion. LLE strength grossly WFL       Communication   Communication: Interpreter utilized;Other (comment) Maryjane Hurter from [facility] interpretation services)  Cognition Arousal/Alertness: Awake/alert Behavior During Therapy: WFL for tasks assessed/performed Overall Cognitive Status: Within Functional Limits for tasks assessed                                        General Comments      Exercises Total Joint Exercises Ankle Circles/Pumps: Strengthening;Both;10 reps;Seated;Other (comment) (Heel  raises) Hip ABduction/ADduction: Strengthening;Both;10 reps;Seated Long Arc Quad: Strengthening;Right;10 reps;Seated Knee Flexion: Strengthening;Right;10 reps;Seated Goniometric ROM: -3 to 107 AAROM, pain limited in flexion Marching in Standing: Strengthening;Both;10 reps;Seated   Assessment/Plan    PT Assessment Patient needs continued PT services  PT Problem List Decreased strength;Decreased range of motion;Decreased activity tolerance;Decreased mobility;Pain;Decreased balance       PT Treatment Interventions DME instruction;Gait training;Stair training;Therapeutic activities;Therapeutic exercise;Balance training;Neuromuscular re-education;Patient/family education;Manual techniques    PT Goals (Current goals can be found in the Care Plan section)  Acute Rehab PT Goals Patient Stated Goal: Return to prior function at home PT Goal Formulation: With patient/family Time For Goal Achievement: 06/10/17 Potential to Achieve Goals: Good    Frequency Min 2X/week   Barriers to discharge        Co-evaluation               AM-PAC PT "6 Clicks" Daily Activity  Outcome Measure Difficulty turning over in bed (including adjusting bedclothes, sheets and blankets)?: None Difficulty moving from lying on back to sitting on the side of the bed? : None Difficulty sitting down on and standing up from a chair with arms (e.g., wheelchair, bedside commode, etc,.)?: None Help needed moving to and from a bed to chair (  including a wheelchair)?: A Little Help needed walking in hospital room?: A Little Help needed climbing 3-5 steps with a railing? : A Little 6 Click Score: 21    End of Session Equipment Utilized During Treatment: Gait belt Activity Tolerance: Patient tolerated treatment well Patient left: in chair;with call bell/phone within reach;with chair alarm set;with family/visitor present;Other (comment) (towel roll under heel) Nurse Communication: Mobility status;Other (comment)  (Respiratory status, supplemental O2 decreased to 1L/min ) PT Visit Diagnosis: Muscle weakness (generalized) (M62.81);Difficulty in walking, not elsewhere classified (R26.2);Pain Pain - Right/Left: Right Pain - part of body: Knee    Time: 0832-0858 PT Time Calculation (min) (ACUTE ONLY): 26 min   Charges:   PT Evaluation $PT Eval Low Complexity: 1 Low PT Treatments $Therapeutic Exercise: 8-22 mins   PT G Codes:   PT G-Codes **NOT FOR INPATIENT CLASS** Functional Assessment Tool Used: AM-PAC 6 Clicks Basic Mobility Functional Limitation: Mobility: Walking and moving around Mobility: Walking and Moving Around Current Status (E2683): At least 20 percent but less than 40 percent impaired, limited or restricted Mobility: Walking and Moving Around Goal Status [phone]): At least 1 percent but less than 20 percent impaired, limited or restricted    Lynnea Maizes PT, DPT   Huprich,Jason 05/27/2017, 10:26 AM

## 2017-05-27 NOTE — Plan of Care (Signed)
Problem: Pain Managment: Goal: General experience of comfort will improve Outcome: Progressing RN will assess pain every 4 hours and administer prn medication as prescribed.

## 2017-05-27 NOTE — Progress Notes (Signed)
SATURATION QUALIFICATIONS: (This note is used to comply with regulatory documentation for home oxygen)  Patient Saturations on Room Air at Rest = 83%  Patient Saturations on Room Air while Ambulating = na%  Patient Saturations on 2 Liters of oxygen while resting = 93%  Please briefly explain why patient needs home oxygen:

## 2017-05-29 LAB — CULTURE, BLOOD (ROUTINE X 2)
Culture: NO GROWTH
Culture: NO GROWTH
Special Requests: ADEQUATE

## 2017-06-08 ENCOUNTER — Encounter: Payer: Self-pay | Admitting: Physical Therapy

## 2017-06-08 ENCOUNTER — Ambulatory Visit: Payer: Medicare Other | Attending: Orthopedic Surgery | Admitting: Physical Therapy

## 2017-06-08 DIAGNOSIS — R269 Unspecified abnormalities of gait and mobility: Secondary | ICD-10-CM | POA: Diagnosis present

## 2017-06-08 DIAGNOSIS — M25661 Stiffness of right knee, not elsewhere classified: Secondary | ICD-10-CM | POA: Diagnosis present

## 2017-06-08 DIAGNOSIS — M6281 Muscle weakness (generalized): Secondary | ICD-10-CM | POA: Insufficient documentation

## 2017-06-08 DIAGNOSIS — M25561 Pain in right knee: Secondary | ICD-10-CM | POA: Diagnosis present

## 2017-06-08 DIAGNOSIS — Z96651 Presence of right artificial knee joint: Secondary | ICD-10-CM | POA: Diagnosis present

## 2017-06-08 NOTE — Therapy (Signed)
[facility] [address]. [place], [place], [address] Phone: [phone]   Fax:  [phone]  Physical Therapy Evaluation  Patient Details  Name: Dana Holloway MRN: [id number] Date of Birth: 03/30/1934 Referring Provider: Dr. Lanell Matar  Encounter Date: 06/08/2017      PT End of Session - 06/08/17 1458    Visit Number 1   Number of Visits 8   Date for PT Re-Evaluation 07/06/17   Authorization - Visit Number 1   Authorization - Number of Visits 10   PT Start Time 1459   PT Stop Time 1549   PT Time Calculation (min) 50 min   Equipment Utilized During Treatment Gait belt   Activity Tolerance Patient tolerated treatment well   Behavior During Therapy [facility] for tasks assessed/performed      Past Medical History:  Diagnosis Date  . Arthritis   . Asthma   . Coronary artery disease   . Diabetes mellitus without complication (HCC)   . Gastritis   . Hypertension   . Orbital osteo-periostitis     Past Surgical History:  Procedure Laterality Date  . CARDIAC SURGERY     cardiac cath with stent 2014  . CHOLECYSTECTOMY    . PERCUTANEOUS CORONARY STENT INTERVENTION (PCI-S)      There were no vitals filed for this visit.       Subjective Assessment - 06/08/17 1453    Subjective Pt. s/p R TKA on 05/13/17.  Pt. had several weeks of PT but was admitted to hospital for PE for 4 days.  Pt. reports 5/10 R knee pain currently at rest in supine position.     Patient is accompained by: Family member   Limitations Sitting;Standing;Lifting;Walking;House hold activities   Patient Stated Goals Increase R knee ROM/ strength/ improve walking.   Currently in Pain? Yes   Pain Score 5    Pain Location Knee   Pain Orientation Right   Pain Descriptors / Indicators Aching   Pain Type Acute pain;Surgical pain            [facility] PT Assessment - 06/08/17 0001      Assessment   Medical Diagnosis R TKA   Referring Provider Dr. Lanell Matar   Onset Date/Surgical Date 05/13/17   Next MD Visit --  06/21/17   Prior Therapy [facility]     Balance Screen   Has the patient fallen in the past 6 months No     See HEP.   Nustep L4 5 min. B UE/LE (fatigue reported)- no increase c/o pain.         PT Education - 06/08/17 1651    Education provided Yes   Education Details See HEP   Person(s) Educated Patient;Child(ren)   Methods Explanation;Demonstration;Verbal cues;Handout   Comprehension Verbalized understanding;Returned demonstration;Need further instruction           PT Long Term Goals - 06/08/17 1711      PT LONG TERM GOAL #1   Title Pt. independent with HEP to increase R knee AROM (0 to 120 deg.) to improve functional mobility.     Baseline R knee AROM: -8 to 116 deg.   Time 4   Period Weeks   Status New   Target Date 08/03/17     PT LONG TERM GOAL #2   Title Pt. will increase LEFS to >45 out of 80 to improve functional mobility at home.     Baseline LEFS: 33 out of 80  on 2023/07/06   Time 4   Period Weeks   Status New   Target Date 08/03/17     PT LONG TERM GOAL #3   Title Pt. able to complete 10 R SLR with no assist to safely progress to [facility] with household ambulation.     Baseline 3 SLR with moderate c/o R hip muscle fatigue/ difficulty   Time 4   Period Weeks   Status New   Target Date 08/03/17     PT LONG TERM GOAL #4   Title Pt. able to complete 4 stairs with recip. gait pattern and use of B UE assist for safety.     Baseline step to gait pattern with stairs and requires UE assist.     Time 4   Period Weeks   Status New   Target Date 08/03/17            Plan - Jul 05, 2017 1459    Clinical Impression Statement Pt. is a pleasant 83 y/o female s/p R TKA on 05/13/17.  Pt. presents with several steristrips in place and small scabs/ dry skin.  Mild R knee joint line swelling/ warmth noted.  L knee AROM: -3 to 129 deg.  R knee AROM:  -8 to 116 deg.  R distal hamstring muscle tightness.  Pt. ambulates with  slight R antalgic gait pattern with use of RW for safety.  Pt. was using RW prior to R knee surgery with no h/o falls reported.  Pt. ascends/descends 4 steps with step to gait pattern and B UE assist on handrails.  LEFS: 33 out of 80.  Pt. will benefit from skilled PT services to increase R knee ROM/ strength to improve functional mobility/ safety.     Clinical Presentation Stable   Clinical Decision Making Moderate   Rehab Potential Good   PT Frequency 2x / week   PT Duration 4 weeks   PT Treatment/Interventions ADLs/Self Care Home Management;Cryotherapy;Moist Heat;Balance training;Therapeutic exercise;Therapeutic activities;Functional mobility training;Stair training;Gait training;Neuromuscular re-education;Patient/family education;Passive range of motion;Manual techniques;Scar mobilization   PT Next Visit Plan Increase R knee ROM/ strength/ improve walking.     PT Home Exercise Plan See HEP   Consulted and Agree with Plan of Care Patient      Patient will benefit from skilled therapeutic intervention in order to improve the following deficits and impairments:  Abnormal gait, Pain, Improper body mechanics, Postural dysfunction, Decreased scar mobility, Decreased mobility, Decreased activity tolerance, Decreased endurance, Decreased range of motion, Decreased strength, Hypomobility, Difficulty walking, Decreased safety awareness, Decreased balance  Visit Diagnosis: History of total knee replacement, right  Acute pain of right knee  Joint stiffness of knee, right  Muscle weakness (generalized)  Gait difficulty      G-Codes - 2017/07/05 1716    Functional Assessment Tool Used (Outpatient Only) Clinical impression/ LEFS/ muscle weakness/ pain/ joint stiffness/ gait difficulty   Functional Limitation Mobility: Walking and moving around   Mobility: Walking and Moving Around Current Status (Z6109) At least 20 percent but less than 40 percent impaired, limited or restricted   Mobility:  Walking and Moving Around Goal Status [phone]) At least 1 percent but less than 20 percent impaired, limited or restricted       Problem List Patient Active Problem List   Diagnosis Date Noted  . Pulmonary embolism (HCC) 05/24/2017  . COPD with acute exacerbation (HCC) 11/15/2015  . Hypoxia 08/24/2015   Cammie Mcgee, PT, DPT # [phone] 07/05/17, 5:17 PM  [facility] [address] [place], [place], [address] Phone: [phone]   Fax:  [phone]  Name: Hulan SaasFaviana Franco de Zuniga MRN: [id number] Date of Birth: [birth year]

## 2017-06-10 ENCOUNTER — Ambulatory Visit: Payer: Medicare Other | Admitting: Physical Therapy

## 2017-06-10 DIAGNOSIS — M25561 Pain in right knee: Secondary | ICD-10-CM

## 2017-06-10 DIAGNOSIS — M25661 Stiffness of right knee, not elsewhere classified: Secondary | ICD-10-CM

## 2017-06-10 DIAGNOSIS — M6281 Muscle weakness (generalized): Secondary | ICD-10-CM

## 2017-06-10 DIAGNOSIS — Z96651 Presence of right artificial knee joint: Secondary | ICD-10-CM | POA: Diagnosis not present

## 2017-06-10 DIAGNOSIS — R269 Unspecified abnormalities of gait and mobility: Secondary | ICD-10-CM

## 2017-06-11 ENCOUNTER — Encounter: Payer: Self-pay | Admitting: Physical Therapy

## 2017-06-11 NOTE — Therapy (Signed)
[facility] [address] [place], [place], [address] Phone: [phone]   Fax:  [phone]  Physical Therapy Treatment  Patient Details  Name: Dana Holloway MRN: [id number] Date of Birth: 08/25/1934 Referring Provider: Dr. Lanell Matar  Encounter Date: 06/10/2017      PT End of Session - 06/11/17 1908    Visit Number 2   Number of Visits 8   Date for PT Re-Evaluation 07/06/17   Authorization - Visit Number 2   Authorization - Number of Visits 10   PT Start Time 1016   PT Stop Time 1111   PT Time Calculation (min) 55 min   Activity Tolerance Patient tolerated treatment well   Behavior During Therapy [facility] for tasks assessed/performed      Past Medical History:  Diagnosis Date  . Arthritis   . Asthma   . Coronary artery disease   . Diabetes mellitus without complication (HCC)   . Gastritis   . Hypertension   . Orbital osteo-periostitis     Past Surgical History:  Procedure Laterality Date  . CARDIAC SURGERY     cardiac cath with stent 2014  . CHOLECYSTECTOMY    . PERCUTANEOUS CORONARY STENT INTERVENTION (PCI-S)      There were no vitals filed for this visit.      Subjective Assessment - 06/11/17 1857    Subjective Pt. entered PT with use of RW with no new complaints.  Pts. daughter present to help as interpreter.     Patient is accompained by: Family member   Limitations Sitting;Standing;Lifting;Walking;House hold activities   Patient Stated Goals Increase R knee ROM/ strength/ improve walking.   Currently in Pain? Yes   Pain Score 2    Pain Location Knee   Pain Orientation Right   Pain Descriptors / Indicators Aching   Pain Type Acute pain;Surgical pain      Manual tx.:  Supine R knee AA/PROM flexion and extension 10x each. Supine R patellar mobs. (all planes) with overpressure into knee extension. STM to distal quad/hamstring during stretches (reassessment of scar).    There.ex.: Reviewed  HEP Supine R LE (heel slides/ SLR/ knee to chest/ bridging 10x each). R LE step ups on 6" step 10x2 with min. To no UE assist Nustep L4 for 10 min. B UE/LE (consistent cadence/ no rest breaks)- no increase c/o pain.  Pt. Will ice R knee at home.          PT Long Term Goals - 06/08/17 1711      PT LONG TERM GOAL #1   Title Pt. independent with HEP to increase R knee AROM (0 to 120 deg.) to improve functional mobility.     Baseline R knee AROM: -8 to 116 deg.   Time 4   Period Weeks   Status New   Target Date 08/03/17     PT LONG TERM GOAL #2   Title Pt. will increase LEFS to >45 out of 80 to improve functional mobility at home.     Baseline LEFS: 33 out of 80 on 9/5   Time 4   Period Weeks   Status New   Target Date 08/03/17     PT LONG TERM GOAL #3   Title Pt. able to complete 10 R SLR with no assist to safely progress to [facility] with household ambulation.     Baseline 3 SLR with moderate c/o R hip muscle fatigue/ difficulty   Time 4  Period Weeks   Status New   Target Date 08/03/17     PT LONG TERM GOAL #4   Title Pt. able to complete 4 stairs with recip. gait pattern and use of B UE assist for safety.     Baseline step to gait pattern with stairs and requires UE assist.     Time 4   Period Weeks   Status New   Target Date 08/03/17               Plan - 06/11/17 1909    Clinical Impression Statement Pt. has moderate R knee swelling/ warmth around knee joint with several small scabs present.  No drainage noted.  Pt. has been compliant with use of ice at home several times/day.  Pt. has difficulty with supine R SLR but able to complete 10x with verbal cuing.  Pt. demonstrates ability to ambulate with no assistive device short distances in clinic with no increase c/o pain.     Clinical Presentation Stable   Clinical Decision Making Moderate   Rehab Potential Good   PT Frequency 2x / week   PT Duration 4 weeks   PT Treatment/Interventions ADLs/Self Care Home  Management;Cryotherapy;Moist Heat;Balance training;Therapeutic exercise;Therapeutic activities;Functional mobility training;Stair training;Gait training;Neuromuscular re-education;Patient/family education;Passive range of motion;Manual techniques;Scar mobilization   PT Next Visit Plan Increase R knee ROM/ strength/ improve walking.     PT Home Exercise Plan See HEP   Consulted and Agree with Plan of Care Patient      Patient will benefit from skilled therapeutic intervention in order to improve the following deficits and impairments:  Abnormal gait, Pain, Improper body mechanics, Postural dysfunction, Decreased scar mobility, Decreased mobility, Decreased activity tolerance, Decreased endurance, Decreased range of motion, Decreased strength, Hypomobility, Difficulty walking, Decreased safety awareness, Decreased balance  Visit Diagnosis: History of total knee replacement, right  Acute pain of right knee  Joint stiffness of knee, right  Muscle weakness (generalized)  Gait difficulty     Problem List Patient Active Problem List   Diagnosis Date Noted  . Pulmonary embolism (HCC) 05/24/2017  . COPD with acute exacerbation (HCC) 11/15/2015  . Hypoxia 08/24/2015   Cammie McgeeMichael C Heer Justiss, PT, DPT # [phone] 06/11/2017, 7:12 PM  [facility] [address] [place], [place], [address] Phone: [phone]   Fax:  [phone]  Name: Dana Holloway MRN: [id number] Date of Birth: Mar 04, 1934

## 2017-06-13 ENCOUNTER — Ambulatory Visit: Payer: Medicare Other | Admitting: Physical Therapy

## 2017-06-13 DIAGNOSIS — M25561 Pain in right knee: Secondary | ICD-10-CM

## 2017-06-13 DIAGNOSIS — Z96651 Presence of right artificial knee joint: Secondary | ICD-10-CM | POA: Diagnosis not present

## 2017-06-13 DIAGNOSIS — M25661 Stiffness of right knee, not elsewhere classified: Secondary | ICD-10-CM

## 2017-06-13 DIAGNOSIS — R269 Unspecified abnormalities of gait and mobility: Secondary | ICD-10-CM

## 2017-06-13 DIAGNOSIS — M6281 Muscle weakness (generalized): Secondary | ICD-10-CM

## 2017-06-14 ENCOUNTER — Encounter: Payer: Self-pay | Admitting: Physical Therapy

## 2017-06-14 NOTE — Therapy (Signed)
[facility] [address] [place], [place], [address] Phone: [phone]   Fax:  [phone]  Physical Therapy Treatment  Patient Details  Name: Dana Holloway MRN: [id number] Date of Birth: Mar 19, 1934 Referring Provider: Dr. Lanell Matarel Gaizo  Encounter Date: 06/13/2017      PT End of Session - 06/14/17 1756    Visit Number 3   Number of Visits 8   Date for PT Re-Evaluation 07/06/17   Authorization - Visit Number 3   Authorization - Number of Visits 10   PT Start Time 1008   PT Stop Time 1102   PT Time Calculation (min) 54 min   Equipment Utilized During Treatment Gait belt   Activity Tolerance Patient tolerated treatment well   Behavior During Therapy [facility] for tasks assessed/performed      Past Medical History:  Diagnosis Date  . Arthritis   . Asthma   . Coronary artery disease   . Diabetes mellitus without complication (HCC)   . Gastritis   . Hypertension   . Orbital osteo-periostitis     Past Surgical History:  Procedure Laterality Date  . CARDIAC SURGERY     cardiac cath with stent 2014  . CHOLECYSTECTOMY    . PERCUTANEOUS CORONARY STENT INTERVENTION (PCI-S)      There were no vitals filed for this visit.      Subjective Assessment - 06/14/17 1327    Subjective Pt. reports no pain.  Pts. daughter reports that scab has fallen off and yellow drainage noted by PT.  PT recommends pt. call MD to discuss infection/ antibiotics.     Patient is accompained by: Family member   Limitations Sitting;Standing;Lifting;Walking;House hold activities   Patient Stated Goals Increase R knee ROM/ strength/ improve walking.   Currently in Pain? No/denies     -7 to 124 deg. R knee AROM in supine position.      Manual tx.:  Supine R knee AA/PROM flexion and extension 10x each. Supine R patellar mobs. (all planes) with overpressure into knee extension. STM to distal quad/hamstring during stretches (yellow drainage/  infection noted).    There.ex.: Standing 2.5# ankle wt.:  Hip flexion/ abd./ heel raises/ hip ext./ knee flexoin 30x each in //-bars.   Supine R LE (heel slides/ SLR/ knee to chest/ bridging 10x each). R LE step ups on 6" step 10x2 with min. To no UE assist Nustep L4 for 10 min. B UE/LE (consistent cadence/ no rest breaks)- no increase c/o pain.  Pt. Will ice R knee at home.          PT Long Term Goals - 06/08/17 1711      PT LONG TERM GOAL #1   Title Pt. independent with HEP to increase R knee AROM (0 to 120 deg.) to improve functional mobility.     Baseline R knee AROM: -8 to 116 deg.   Time 4   Period Weeks   Status New   Target Date 08/03/17     PT LONG TERM GOAL #2   Title Pt. will increase LEFS to >45 out of 80 to improve functional mobility at home.     Baseline LEFS: 33 out of 80 on 9/5   Time 4   Period Weeks   Status New   Target Date 08/03/17     PT LONG TERM GOAL #3   Title Pt. able to complete 10 R SLR with no assist to [facility].  Baseline 3 SLR with moderate c/o R hip muscle fatigue/ difficulty   Time 4   Period Weeks   Status New   Target Date 08/03/17     PT LONG TERM GOAL #4   Title Pt. able to complete 4 stairs with recip. gait pattern and use of B UE assist for safety.     Baseline step to gait pattern with stairs and requires UE assist.     Time 4   Period Weeks   Status New   Target Date 08/03/17            Plan - 06/14/17 1756    Clinical Impression Statement Pt. ambulates into PT gym with use of RW and consistent step pattern/ knee flexion.  R knee redness/warmth remains and signs of yellow drainage/infection noted under scab that fell off this past weekend.  PT advised pt. to contact MD to discuss healing of incision/ antibiotics.  R knee AROM: -7 to 124 deg.     Clinical Presentation Stable   Clinical Decision Making Moderate   Rehab Potential Good   PT Frequency 2x / week   PT Duration 4  weeks   PT Treatment/Interventions ADLs/Self Care Home Management;Cryotherapy;Moist Heat;Balance training;Therapeutic exercise;Therapeutic activities;Functional mobility training;Stair training;Gait training;Neuromuscular re-education;Patient/family education;Passive range of motion;Manual techniques;Scar mobilization   PT Next Visit Plan Increase R knee ROM/ strength/ improve walking.  Check on incision healing/ ?infection.     PT Home Exercise Plan See HEP   Consulted and Agree with Plan of Care Patient      Patient will benefit from skilled therapeutic intervention in order to improve the following deficits and impairments:  Abnormal gait, Pain, Improper body mechanics, Postural dysfunction, Decreased scar mobility, Decreased mobility, Decreased activity tolerance, Decreased endurance, Decreased range of motion, Decreased strength, Hypomobility, Difficulty walking, Decreased safety awareness, Decreased balance  Visit Diagnosis: History of total knee replacement, right  Acute pain of right knee  Joint stiffness of knee, right  Muscle weakness (generalized)  Gait difficulty     Problem List Patient Active Problem List   Diagnosis Date Noted  . Pulmonary embolism (HCC) 05/24/2017  . COPD with acute exacerbation (HCC) 11/15/2015  . Hypoxia 08/24/2015   Cammie Mcgee, PT, DPT # [phone] 06/14/2017, 5:59 PM   [facility] [address] [place], [place], [address] Phone: [phone]   Fax:  [phone]  Name: Dana Holloway MRN: [id number] Date of Birth: Jun 07, 1934

## 2017-06-16 ENCOUNTER — Ambulatory Visit: Payer: Medicare Other | Admitting: Physical Therapy

## 2017-06-16 DIAGNOSIS — R269 Unspecified abnormalities of gait and mobility: Secondary | ICD-10-CM

## 2017-06-16 DIAGNOSIS — M6281 Muscle weakness (generalized): Secondary | ICD-10-CM

## 2017-06-16 DIAGNOSIS — M25661 Stiffness of right knee, not elsewhere classified: Secondary | ICD-10-CM

## 2017-06-16 DIAGNOSIS — Z96651 Presence of right artificial knee joint: Secondary | ICD-10-CM | POA: Diagnosis not present

## 2017-06-16 DIAGNOSIS — M25561 Pain in right knee: Secondary | ICD-10-CM

## 2017-06-17 NOTE — Therapy (Addendum)
[facility] [address] [place], [place], [address] Phone: [phone]   Fax:  [phone]  Physical Therapy Treatment  Patient Details  Name: Dana Holloway MRN: [id number] Date of Birth: July 29, 1934 Referring Provider: Dr. Lanell Matar  Encounter Date: 06/16/2017      PT End of Session - 06/17/17 0039    Visit Number 4   Number of Visits 8   Date for PT Re-Evaluation 07/06/17   Authorization - Visit Number 4   Authorization - Number of Visits 10   PT Start Time 0951   PT Stop Time 1045   PT Time Calculation (min) 54 min   Activity Tolerance Patient tolerated treatment well   Behavior During Therapy [facility] for tasks assessed/performed      Past Medical History:  Diagnosis Date  . Arthritis   . Asthma   . Coronary artery disease   . Diabetes mellitus without complication (HCC)   . Gastritis   . Hypertension   . Orbital osteo-periostitis     Past Surgical History:  Procedure Laterality Date  . CARDIAC SURGERY     cardiac cath with stent 2014  . CHOLECYSTECTOMY    . PERCUTANEOUS CORONARY STENT INTERVENTION (PCI-S)      There were no vitals filed for this visit.      Subjective Assessment - 06/17/17 0038    Subjective Pt. states she is doing okay.  Pt. has started antibiotics for R knee infection.  Pt. has yellow drainage noted on R knee bandage.     Patient is accompained by: Family member   Limitations Sitting;Standing;Lifting;Walking;House hold activities   Patient Stated Goals Increase R knee ROM/ strength/ improve walking.   Currently in Pain? No/denies         -7 to 124 deg. R knee AROM in supine position.      Manual tx.:  Supine R knee AA/PROM flexion and extension 10x each. Supine R patellar mobs. (all planes) with overpressure into knee extension. STM to distal quad/hamstring during stretches (yellow drainage/ infection noted).    There.ex.: Nustep L4 for 10 min. B UE/LE (consistent  cadence/ no rest breaks)- no increase c/o pain but fatigue noted. Supine R LE (heel slides/ SLR/ knee to chest/ bridging 10x each). R LE step ups on 6" step 10x2 with min. To no UE assist Ambulate in clinic with use of SPC in L UE with 2-point gait pattern.   Standing hip flexion/ abd./ heel raised/ knee flexion (cuing for posture/ reps) 20x.   Pt. Will ice R knee at home.       Increase R lateral knee pain with manual tx. to 3-4/10 in supine position.  Pt. able to ambulate with improved gait pattern with use of SPC in L hand.  Good tx. tolerance with standing ther.ex. and pt. works hard with increasing knee ROM, esp. extension.  Moderate redness/ warmth in R knee and continued yellowish drainage noted.          PT Long Term Goals - 06/08/17 1711      PT LONG TERM GOAL #1   Title Pt. independent with HEP to increase R knee AROM (0 to 120 deg.) to improve functional mobility.     Baseline R knee AROM: -8 to 116 deg.   Time 4   Period Weeks   Status New   Target Date 08/03/17     PT LONG TERM GOAL #2   Title Pt. will increase LEFS to >  45 out of 80 to improve functional mobility at home.     Baseline LEFS: 33 out of 80 on 9/5   Time 4   Period Weeks   Status New   Target Date 08/03/17     PT LONG TERM GOAL #3   Title Pt. able to complete 10 R SLR with no assist to safely progress to [facility] with household ambulation.     Baseline 3 SLR with moderate c/o R hip muscle fatigue/ difficulty   Time 4   Period Weeks   Status New   Target Date 08/03/17     PT LONG TERM GOAL #4   Title Pt. able to complete 4 stairs with recip. gait pattern and use of B UE assist for safety.     Baseline step to gait pattern with stairs and requires UE assist.     Time 4   Period Weeks   Status New   Target Date 08/03/17               Plan - 06/17/17 0040    Clinical Presentation Stable   Clinical Decision Making Moderate   Rehab Potential Good   PT Frequency 2x / week   PT  Duration 4 weeks   PT Treatment/Interventions ADLs/Self Care Home Management;Cryotherapy;Moist Heat;Balance training;Therapeutic exercise;Therapeutic activities;Functional mobility training;Stair training;Gait training;Neuromuscular re-education;Patient/family education;Passive range of motion;Manual techniques;Scar mobilization   PT Next Visit Plan Increase R knee ROM/ strength/ improve walking.  Check on incision healing/ ?infection.     PT Home Exercise Plan See HEP   Consulted and Agree with Plan of Care Patient      Patient will benefit from skilled therapeutic intervention in order to improve the following deficits and impairments:  Abnormal gait, Pain, Improper body mechanics, Postural dysfunction, Decreased scar mobility, Decreased mobility, Decreased activity tolerance, Decreased endurance, Decreased range of motion, Decreased strength, Hypomobility, Difficulty walking, Decreased safety awareness, Decreased balance  Visit Diagnosis: History of total knee replacement, right  Acute pain of right knee  Joint stiffness of knee, right  Muscle weakness (generalized)  Gait difficulty     Problem List Patient Active Problem List   Diagnosis Date Noted  . Pulmonary embolism (HCC) 05/24/2017  . COPD with acute exacerbation (HCC) 11/15/2015  . Hypoxia 08/24/2015   Cammie Mcgee, PT, DPT # [id number] 06/17/2017, 12:41 AM  [facility] [address] [place], [place], [address] Phone: [phone]   Fax:  [phone]  Name: Dana Holloway MRN: [id number] Date of Birth: 1934/07/28

## 2017-06-20 ENCOUNTER — Ambulatory Visit: Payer: Medicare Other | Admitting: Physical Therapy

## 2017-06-20 ENCOUNTER — Encounter: Payer: Self-pay | Admitting: Physical Therapy

## 2017-06-20 DIAGNOSIS — Z96651 Presence of right artificial knee joint: Secondary | ICD-10-CM

## 2017-06-20 DIAGNOSIS — M25561 Pain in right knee: Secondary | ICD-10-CM

## 2017-06-20 DIAGNOSIS — M6281 Muscle weakness (generalized): Secondary | ICD-10-CM

## 2017-06-20 DIAGNOSIS — M25661 Stiffness of right knee, not elsewhere classified: Secondary | ICD-10-CM

## 2017-06-20 DIAGNOSIS — R269 Unspecified abnormalities of gait and mobility: Secondary | ICD-10-CM

## 2017-06-20 NOTE — Therapy (Signed)
[place] [facility] [address] [place], [place], [address] Phone: [phone]   Fax:  [phone]  Physical Therapy Treatment  Patient Details  Name: Dana Holloway MRN: [id number] Date of Birth: 08-Apr-1934 Referring Provider: Dr. Lanell Matar  Encounter Date: 06/20/2017      PT End of Session - 06/20/17 0959    Visit Number 5   Number of Visits 8   Date for PT Re-Evaluation 07/06/17   Authorization - Visit Number 5   Authorization - Number of Visits 10   PT Start Time 0949   PT Stop Time 1040   PT Time Calculation (min) 51 min   Equipment Utilized During Treatment Gait belt   Activity Tolerance Patient tolerated treatment well   Behavior During Therapy [facility] for tasks assessed/performed      Past Medical History:  Diagnosis Date  . Arthritis   . Asthma   . Coronary artery disease   . Diabetes mellitus without complication (HCC)   . Gastritis   . Hypertension   . Orbital osteo-periostitis     Past Surgical History:  Procedure Laterality Date  . CARDIAC SURGERY     cardiac cath with stent 2014  . CHOLECYSTECTOMY    . PERCUTANEOUS CORONARY STENT INTERVENTION (PCI-S)      There were no vitals filed for this visit.      Subjective Assessment - 06/20/17 0958    Subjective Pt. reports minimal R knee discomfort (1/10) currently.  Pt. entered PT with use of RW but is using SPC at home.     Patient is accompained by: Family member   Limitations Sitting;Standing;Lifting;Walking;House hold activities   Patient Stated Goals Increase R knee ROM/ strength/ improve walking.   Currently in Pain? Yes   Pain Score 1    Pain Location Knee   Pain Orientation Right   Pain Descriptors / Indicators Aching   Pain Type Acute pain;Surgical pain        -7 to 124 deg. R knee AROM in supine position.    Manual tx.:  Supine R knee AA/PROM flexion and extension 10x each. Supine R patellar mobs. (all planes) with overpressure  into knee extension. STM to distal quad/hamstring during stretches (yellow drainage/ infection noted).   There.ex.: Nustep L6 for 10 min. B UE/LE (consistent cadence/ no rest breaks)- no increase c/o pain but fatigue noted. Standing hip flexion/ abd./ squats 20x each with min. //-bars assist.   Airex step ups/downs with no UE assist 20x each.   Resisted gait in //-bars:  Forward/backwards/ sidestepping 8x with no UE assist (slight increase c/o low back discomfort).   Supine knee to chest with ball 20x.  Supine SLR/ knee to chest with no PT assist 20x. Reviewed HEP/ progression.             PT Long Term Goals - 06/08/17 1711      PT LONG TERM GOAL #1   Title Pt. independent with HEP to increase R knee AROM (0 to 120 deg.) to improve functional mobility.     Baseline R knee AROM: -8 to 116 deg.   Time 4   Period Weeks   Status New   Target Date 08/03/17     PT LONG TERM GOAL #2   Title Pt. will increase LEFS to >45 out of 80 to improve functional mobility at home.     Baseline LEFS: 33 out of 80 on 9/5   Time 4   Period  Weeks   Status New   Target Date 08/03/17     PT LONG TERM GOAL #3   Title Pt. able to complete 10 R SLR with no assist to safely progress to [facility] with household ambulation.     Baseline 3 SLR with moderate c/o R hip muscle fatigue/ difficulty   Time 4   Period Weeks   Status New   Target Date 08/03/17     PT LONG TERM GOAL #4   Title Pt. able to complete 4 stairs with recip. gait pattern and use of B UE assist for safety.     Baseline step to gait pattern with stairs and requires UE assist.     Time 4   Period Weeks   Status New   Target Date 08/03/17               Plan - 06/20/17 0959    Clinical Impression Statement Pt. is able to ambulate with no assistive device and consistent hip/knee flexion/ step pattern with no increase c/o pain.  Pt. benefits from use of SPC at this time, esp. with turning/ maneuvering around clinic.  Pts.  incision still shows signs of infection/ small amount of drainage.  Pt. continues to take antibiotics and is scheduling a f/u with Family MD.  Pt. presents with good R knee flexion and remains limited with extension (-7 deg.).     Clinical Presentation Stable   Clinical Decision Making Moderate   Rehab Potential Good   PT Frequency 2x / week   PT Duration 4 weeks   PT Treatment/Interventions ADLs/Self Care Home Management;Cryotherapy;Moist Heat;Balance training;Therapeutic exercise;Therapeutic activities;Functional mobility training;Stair training;Gait training;Neuromuscular re-education;Patient/family education;Passive range of motion;Manual techniques;Scar mobilization   PT Next Visit Plan Increase R knee ROM/ strength/ improve walking.     PT Home Exercise Plan See HEP   Consulted and Agree with Plan of Care Patient      Patient will benefit from skilled therapeutic intervention in order to improve the following deficits and impairments:  Abnormal gait, Pain, Improper body mechanics, Postural dysfunction, Decreased scar mobility, Decreased mobility, Decreased activity tolerance, Decreased endurance, Decreased range of motion, Decreased strength, Hypomobility, Difficulty walking, Decreased safety awareness, Decreased balance  Visit Diagnosis: History of total knee replacement, right  Acute pain of right knee  Joint stiffness of knee, right  Muscle weakness (generalized)  Gait difficulty     Problem List Patient Active Problem List   Diagnosis Date Noted  . Pulmonary embolism (HCC) 05/24/2017  . COPD with acute exacerbation (HCC) 11/15/2015  . Hypoxia 08/24/2015   Cammie Mcgee, PT, DPT # [id number] 06/20/2017, 6:42 PM   [facility] [address] [place], [place], [address] Phone: [phone]   Fax:  [phone]  Name: Dana Holloway MRN: [id number] Date of Birth: 1934-09-07

## 2017-06-23 ENCOUNTER — Ambulatory Visit: Payer: Medicare Other | Admitting: Physical Therapy

## 2017-06-27 ENCOUNTER — Ambulatory Visit: Payer: Medicare Other | Admitting: Physical Therapy

## 2017-06-30 ENCOUNTER — Ambulatory Visit: Payer: Medicare Other | Admitting: Physical Therapy

## 2017-07-27 ENCOUNTER — Encounter: Payer: Self-pay | Admitting: Physical Therapy

## 2017-07-27 ENCOUNTER — Ambulatory Visit: Payer: Medicare Other | Attending: Orthopedic Surgery | Admitting: Physical Therapy

## 2017-07-27 DIAGNOSIS — M6281 Muscle weakness (generalized): Secondary | ICD-10-CM | POA: Diagnosis present

## 2017-07-27 DIAGNOSIS — Z96651 Presence of right artificial knee joint: Secondary | ICD-10-CM | POA: Diagnosis present

## 2017-07-27 DIAGNOSIS — R262 Difficulty in walking, not elsewhere classified: Secondary | ICD-10-CM | POA: Insufficient documentation

## 2017-07-27 DIAGNOSIS — M25561 Pain in right knee: Secondary | ICD-10-CM | POA: Insufficient documentation

## 2017-07-27 NOTE — Therapy (Signed)
[place] [facility] [address] [place], [place], [address] Phone: [phone]   Fax:  [phone]  Physical Therapy Evaluation  Patient Details  Name: Dana SaasFaviana Franco de Zuniga MRN: [id number] Date of Birth: 11/11/1933 Referring Provider: Dr. Lanell Matarel Gaizo  Encounter Date: 07/27/2017      PT End of Session - 07/27/17 1144    Visit Number 1   Number of Visits 8   Date for PT Re-Evaluation 08/24/17   Authorization Type G codes   Authorization - Visit Number 1   Authorization - Number of Visits 10   PT Start Time 1034   PT Stop Time 1123   PT Time Calculation (min) 49 min   Equipment Utilized During Treatment Gait belt   Activity Tolerance Patient tolerated treatment well;No increased pain   Behavior During Therapy WFL for tasks assessed/performed      Past Medical History:  Diagnosis Date  . Arthritis   . Asthma   . Coronary artery disease   . Diabetes mellitus without complication (HCC)   . Gastritis   . Hypertension   . Orbital osteo-periostitis     Past Surgical History:  Procedure Laterality Date  . CARDIAC SURGERY     cardiac cath with stent 2014  . CHOLECYSTECTOMY    . PERCUTANEOUS CORONARY STENT INTERVENTION (PCI-S)      There were no vitals filed for this visit.       Subjective Assessment - 07/27/17 1039    Subjective Pt reports pain is 3/ 10 currently with worst pain 5/10 in last 24 hours in R knee along incision    Patient is accompained by: Family member   Pertinent History 83 y/o female returns to clinic s/p superficial wound breakdown/suture abscess status post initial right knee replacement 05/13/17. Surgical debridement and revision/excision of scar right knee was performed on 06/23/17. Pt has had HHPT that ended on 07/27/17 and now presents to outpatient clinic for continued PT.   Limitations Sitting;Standing;Lifting;Walking;House hold activities   How long can you walk comfortably? 10 min    Patient  Stated Goals Increase R knee ROM/ strength/ improve walking.   Currently in Pain? Yes   Pain Score 3    Pain Location Knee   Pain Orientation Right   Pain Descriptors / Indicators Aching   Pain Type Surgical pain;Chronic pain   Pain Onset More than a month ago   Pain Frequency Intermittent   Aggravating Factors  Max flexion, prolonged walking   Pain Relieving Factors Rest   Effect of Pain on Daily Activities Limited standing activity            [facility] PT Assessment - 07/27/17 0001      Assessment   Medical Diagnosis R TKA   Referring Provider Dr. Lanell Matarel Gaizo   Onset Date/Surgical Date 05/13/17   Next MD Visit 1 year follow up   Prior Therapy Yes, pt known to clinic     Restrictions   Weight Bearing Restrictions No     Balance Screen   Has the patient fallen in the past 6 months No   Has the patient had a decrease in activity level because of a fear of falling?  No   Is the patient reluctant to leave their home because of a fear of falling?  No         PROM/AROM:  Upper Extremity: NT  Lower extremity: L knee AROM: 0-3-134 deg.  R knee AROM:  0-6-112 deg, hip AROM  appears to be [facility] by gross assessment. Pt able to extend hip to neutral in supine. DF past neutral in sitting bilaterally   STRENGTH:  Graded on a 0-5 scale Muscle Group Left Right  Hip Flex 4- 4-  Hip Abd 4+ 4+  Hip Add 4+ 4+  Knee Flex 5 4  Knee Ext 4+ 4  Ankle DF 5 4  Sensory:   Light touch: Pt reports numbness in anterior knee lateral to incision, no change since last evaluation   Observations:   Posture: Pt sits with slightly flexed posture, rounded shoulders, forward head.   BALANCE: Pt with slightly increased sway in [place] with eyes closed, pt able to stand one foot forward/modified tandem with no LOB, unable to stand tandem without UE assist. Pt withstands minimal perturbation with quick release with functional postural response. Reduced hip and ankle strategy noted.   Static Standing  Balance  Normal Able to maintain standing balance against maximal resistance   Good Able to maintain standing balance against moderate resistance   Good-/Fair+ Able to maintain standing balance against minimal resistance X  Fair Able to stand unsupported without UE support and without LOB for 1-2 min   Fair- Requires Min A and UE support to maintain standing without loss of balance   Poor+ Requires mod A and UE support to maintain standing without loss of balance   Poor Requires max A and UE support to maintain standing balance without loss     Standing Dynamic Balance  Normal Stand independently unsupported, able to weight shift and cross midline maximally   Good Stand independently unsupported, able to weight shift and cross midline moderately   Good-/Fair+ Stand independently unsupported, able to weight shift across midline minimally X  Fair Stand independently unsupported, weight shift, and reach ipsilaterally, loss of balance when crossing midline   Poor+ Able to stand with Min A and reach ipsilaterally, unable to weight shift   Poor+ Able to stand with Mod A and minimally reach ipsilaterally, unable to cross midline.       FUNCTIONAL MOBILITY:   Transfers: Pt able to stand without UE assist without LOB. Sit<>supine with max effort and increased time.   Gait: Pt with slightly antalgic on R with SCP in L hand and proper 2 point sequencing, reciprocal pattern, and similar stride length bilaterally.    OUTCOME MEASURES: TEST Outcome Interpretation  5 times sit<>stand        18sec with no UE assist Below age group norms, indicates fall risk    LEFS                34/80 40-60% disability   10 meter walk test                0.108m/s with SPC in L hand and CGA Limited community ambulator      Objective measurements completed on examination: See above findings.     TREATMENT  Supine Quad set 2 x 10 with manual overpressure, bolster under ankle, SLR x 10  Sitting LAQ  resisted with yellow t-band instructed.  Above exercises added to HEP with instructions/pictures in Spanish (see pt instructions)             PT Education - 07/27/17 1144    Education provided Yes   Education Details Plan of care, exam findings   Person(s) Educated Patient;Child(ren)   Methods Explanation;Demonstration;Tactile cues;Verbal cues;Handout   Comprehension Verbal cues required;Tactile cues required;Returned demonstration;Verbalized understanding  PT Long Term Goals - 07/27/17 1200      PT LONG TERM GOAL #1   Title Pt. independent with HEP to increase R knee AROM (0-3-120 deg.) to improve functional mobility.     Baseline R knee AROM: -0-6-112 deg.   Time 4   Period Weeks   Status New   Target Date 08/24/17     PT LONG TERM GOAL #2   Title Pt. will increase LEFS to >45 out of 80 to improve functional mobility at home.     Baseline LEFS: 33 out of 80 on 9/5, 34/80 on 10/24   Time 4   Period Weeks   Status New   Target Date 08/24/17     PT LONG TERM GOAL #3   Title Pt will tolerate walking 20 minutes without having to sit due to R knee pain or fatigue   Baseline able to walk 10 min before hving to sit   Time 4   Period Weeks   Status New   Target Date 08/24/17     PT LONG TERM GOAL #4   Title Pt. able to complete 4 stairs with recip. gait pattern and use of B UE assist for safety.     Baseline Assess next visit   Time 4   Period Weeks   Status New   Target Date 08/24/17     PT LONG TERM GOAL #5   Title Patient will increase BLE gross strength to 4+/5 as to improve functional strength for independent gait, increased standing tolerance and increased ADL ability.   Baseline hip flexion 4-/5, knee flexion/ext R 4/5, DF R 4/5   Time 4   Period Weeks   Status New   Target Date 08/24/17     Additional Long Term Goals   Additional Long Term Goals Yes     PT LONG TERM GOAL #6   Title Patient will increase 10 meter walk test to >0.40m/s  as to improve gait speed for better community ambulation and to reduce fall risk.   Baseline 0.61m/s with SPC   Time 4   Period Weeks   Status New   Target Date 08/24/17     PT LONG TERM GOAL #7   Title Patient (> 57 years old) will complete five times sit to stand test in < 15 seconds indicating an increased LE strength and improved balance.   Baseline 18 sec with no UE assist   Time 4   Period Weeks   Status New   Target Date 08/24/17                Plan - 07/27/17 1129    Clinical Impression Statement Pt. is a pleasant 83 y/o female s/p R TKA on 05/13/17 that returns to clinic following wound breakdown/suture abscess. Patient was taken to OR on 06/23/17 for revision/excision of scar on right knee. Pt's wound appears to be healing well with no opening and minimal redness along incision. L knee AROM: 0-3-134 deg.  R knee AROM:  0-6-112 deg. Pt continues to have R hamstring tightness. Gait is slightly antalgic on R with SCP in L hand, proper 2 point sequencing, reciprocal pattern and similar stride length bilaterally. No reported falls. LEFS (34/80) is not significantly improved from previous PT evaluation. 5XSTS and indicate pt is a limited community ambulator and would benefit from intervention to reduce fall risk. Pt. will benefit from skilled PT services to increase R knee ROM/ strength to improve functional mobility/  safety.   History and Personal Factors relevant to plan of care: (-) advanced age, Spanish speaking without profesisonal translator (+) good caregiver support   Clinical Presentation Stable   Clinical Presentation due to: No falls, knee pain/function improving   Clinical Decision Making Low   Rehab Potential Good   PT Frequency 2x / week   PT Duration 4 weeks   PT Treatment/Interventions ADLs/Self Care Home Management;Cryotherapy;Moist Heat;Balance training;Therapeutic exercise;Therapeutic activities;Functional mobility training;Stair training;Gait  training;Neuromuscular re-education;Patient/family education;Passive range of motion;Manual techniques;Scar mobilization;Biofeedback;Energy conservation   PT Next Visit Plan Assess stairs, gait without cane, advance HEP with hip and general LE strengthening exercise, backwards walking, mobs   PT Home Exercise Plan LAQ resisted, quad set, SLR   Consulted and Agree with Plan of Care Patient      Patient will benefit from skilled therapeutic intervention in order to improve the following deficits and impairments:  Abnormal gait, Pain, Improper body mechanics, Postural dysfunction, Decreased scar mobility, Decreased mobility, Decreased activity tolerance, Decreased endurance, Decreased range of motion, Decreased strength, Hypomobility, Difficulty walking, Decreased safety awareness, Decreased balance, Impaired flexibility  Visit Diagnosis: Acute pain of right knee  Muscle weakness (generalized)  Difficulty in walking, not elsewhere classified     Problem List Patient Active Problem List   Diagnosis Date Noted  . Pulmonary embolism (HCC) 05/24/2017  . COPD with acute exacerbation (HCC) 11/15/2015  . Hypoxia 08/24/2015   Cassell Smiles, SPT  07/27/2017, 12:20 PM    This entire session was performed under direct supervision and direction of a licensed therapist/therapist assistant . I have personally read, edited and approve of the note as written. Encarnacion Chu PT, DPT  [place] [facility] [address] [place], [place], [address] Phone: [phone]   Fax:  [phone]  Name: Victoire Deans MRN: [id number] Date of Birth: Feb 22, 1934

## 2017-07-27 NOTE — Patient Instructions (Addendum)
Leg Extension    Sentado con la parte posterior de la rodilla al borde de la silla, pie sin apoyar, espalda derecha. Inhale despus mientras exhala, extienda la pierna. Sostenga por cuenta de 1. Descienda la pierna lentamente. Repita __10_ veces por rutina. Realice __2_ rutinas por sesin. Realice ___ sesiones por da.   Copyright  VHI. All rights reserved.  Quad Set    Contraiga lentamente el msculo anterior del muslo de la pierna estirada mientras cuenta en voz alta hasta _5 segundos___. Repita con la pierna opuesta. Repita la serie _10___ veces. Haga _2___ sesiones por da.  Straight Leg Raise    Contraiga los msculos del estomago y eleve lentamente la pierna derecha a ____ cm. del piso. Repita __12__ veces por rutina. Realice __2__ Carlis Stablerutinas por sesin. Realice __2__ sesiones por da.  [email or web address].exer.[email or web address]   Copyright  VHI. All rights reserved.

## 2017-08-01 ENCOUNTER — Ambulatory Visit: Payer: Medicare Other | Admitting: Physical Therapy

## 2017-08-01 ENCOUNTER — Encounter: Payer: Self-pay | Admitting: Physical Therapy

## 2017-08-01 DIAGNOSIS — R262 Difficulty in walking, not elsewhere classified: Secondary | ICD-10-CM

## 2017-08-01 DIAGNOSIS — M25561 Pain in right knee: Secondary | ICD-10-CM

## 2017-08-01 DIAGNOSIS — M6281 Muscle weakness (generalized): Secondary | ICD-10-CM

## 2017-08-01 DIAGNOSIS — Z96651 Presence of right artificial knee joint: Secondary | ICD-10-CM

## 2017-08-01 NOTE — Therapy (Signed)
[facility] [address] [place], [place], [address] Phone: [phone]   Fax:  [phone]  Physical Therapy Treatment  Patient Details  Name: Dana Holloway MRN: [id number] Date of Birth: 02/26/1934 Referring Provider: Dr. Lanell Matar  Encounter Date: 08/01/2017      PT End of Session - 08/01/17 1352    Visit Number 2   Number of Visits 8   Date for PT Re-Evaluation 09-Sep-2017   Authorization Type G codes   Authorization - Visit Number 2   Authorization - Number of Visits 10   PT Start Time 1343   PT Stop Time 1429   PT Time Calculation (min) 46 min   Activity Tolerance Patient tolerated treatment well;No increased pain   Behavior During Therapy WFL for tasks assessed/performed      Past Medical History:  Diagnosis Date  . Arthritis   . Asthma   . Coronary artery disease   . Diabetes mellitus without complication (HCC)   . Gastritis   . Hypertension   . Orbital osteo-periostitis     Past Surgical History:  Procedure Laterality Date  . CARDIAC SURGERY     cardiac cath with stent 2014  . CHOLECYSTECTOMY    . PERCUTANEOUS CORONARY STENT INTERVENTION (PCI-S)      There were no vitals filed for this visit.      Subjective Assessment - 08/01/17 1352    Subjective Pt. reports 5/10 R knee pain currently.  No other issues reported per pts. daughter.     Patient is accompained by: Family member   Pertinent History 83 y/o female returns to clinic s/p superficial wound breakdown/suture abscess status post initial right knee replacement 05/13/17. Surgical debridement and revision/excision of scar right knee was performed on 06/23/17. Pt has had HHPT that ended on 07/27/17 and now presents to outpatient clinic for continued PT.   Limitations Sitting;Standing;Lifting;Walking;House hold activities   How long can you walk comfortably? 10 min    Patient Stated Goals Increase R knee ROM/ strength/ improve walking.   Currently in Pain? Yes   Pain Score 5    Pain Location Knee   Pain Orientation Right   Pain Descriptors / Indicators Aching   Pain Type Surgical pain;Chronic pain      There.ex.:  Nustep L6 10 min. B UE/LE (warm-up/no charge)- fatigue reported.  Seated/ standing hip and knee ex. Program with 2.5# (seated hip flexion/ LAQ/ heel raises/ standing marching/ hip abd./ knee flexion)- 20x each.  Step ups on 6" step with 1 UE to no UE assist (no ankle wts.)- 20x.  Supine SLR/ SAQ with manual resistance/ L sidelying R hip abd. 20x each (good technique).  Reviewed HEP  Manual tx.:  Supine scar reassessment (good healing).  Patellar mobs. (all planes)- grade III 2x20 sec.  Supine R knee AAROM flexion/extension with static holds.  -6 to 122 deg.          PT Long Term Goals - 07/27/17 1200      PT LONG TERM GOAL #1   Title Pt. independent with HEP to increase R knee AROM (0-3-120 deg.) to improve functional mobility.     Baseline R knee AROM: -0-6-112 deg.   Time 4   Period Weeks   Status New   Target Date September 09, 2017     PT LONG TERM GOAL #2   Title Pt. will increase LEFS to >45 out of 80 to improve functional mobility at home.  Baseline LEFS: 33 out of 80 on 9/5, 34/80 on 10/24   Time 4   Period Weeks   Status New   Target Date 08/24/17     PT LONG TERM GOAL #3   Title Pt will tolerate walking 20 minutes without having to sit due to R knee pain or fatigue   Baseline able to walk 10 min before hving to sit   Time 4   Period Weeks   Status New   Target Date 08/24/17     PT LONG TERM GOAL #4   Title Pt. able to complete 4 stairs with recip. gait pattern and use of B UE assist for safety.     Baseline Assess next visit   Time 4   Period Weeks   Status New   Target Date 08/24/17     PT LONG TERM GOAL #5   Title Patient will increase BLE gross strength to 4+/5 as to improve functional strength for independent gait, increased standing tolerance and increased ADL ability.   Baseline  hip flexion 4-/5, knee flexion/ext R 4/5, DF R 4/5   Time 4   Period Weeks   Status New   Target Date 08/24/17     Additional Long Term Goals   Additional Long Term Goals Yes     PT LONG TERM GOAL #6   Title Patient will increase 10 meter walk test to >0.8129m/s as to improve gait speed for better community ambulation and to reduce fall risk.   Baseline 0.6758m/s with SPC   Time 4   Period Weeks   Status New   Target Date 08/24/17     PT LONG TERM GOAL #7   Title Patient (> 81 years old) will complete five times sit to stand test in < 15 seconds indicating an increased LE strength and improved balance.   Baseline 18 sec with no UE assist   Time 4   Period Weeks   Status New   Target Date 08/24/17           Plan - 08/01/17 1818    Clinical Impression Statement Pt. has marked fatigue with generalized ther.ex. program in //-bars.  Pt. reports no increase c/o knee pain with therex./ stretches.  R knee AROM (in supine): -6 to 122 deg. (pain tolerable).  No change to HEP at this time and PT encouraged pt. to continue with walking program/ ther.ex. ex.  Pt. is weaning off use of SPC in house/ short distances outside.  Good incision healing noted.    Clinical Presentation Stable   Clinical Decision Making Low   Rehab Potential Good   PT Frequency 2x / week   PT Duration 4 weeks   PT Treatment/Interventions ADLs/Self Care Home Management;Cryotherapy;Moist Heat;Balance training;Therapeutic exercise;Therapeutic activities;Functional mobility training;Stair training;Gait training;Neuromuscular re-education;Patient/family education;Passive range of motion;Manual techniques;Scar mobilization;Biofeedback;Energy conservation   PT Next Visit Plan Increase R LE strengthening/ independence with gait.     PT Home Exercise Plan LAQ resisted, quad set, SLR   Consulted and Agree with Plan of Care Patient      Patient will benefit from skilled therapeutic intervention in order to improve the following  deficits and impairments:  Abnormal gait, Pain, Improper body mechanics, Postural dysfunction, Decreased scar mobility, Decreased mobility, Decreased activity tolerance, Decreased endurance, Decreased range of motion, Decreased strength, Hypomobility, Difficulty walking, Decreased safety awareness, Decreased balance, Impaired flexibility  Visit Diagnosis: Acute pain of right knee  Muscle weakness (generalized)  Difficulty in walking, not elsewhere classified  History of total knee replacement, right     Problem List Patient Active Problem List   Diagnosis Date Noted  . Pulmonary embolism (HCC) 05/24/2017  . COPD with acute exacerbation (HCC) 11/15/2015  . Hypoxia 08/24/2015   Cammie Mcgee, PT, DPT # [id number] 08/02/2017, 5:37 PM  [facility] [address] [place] [address] [place], [place], [address] Phone: [phone]   Fax:  [phone]  Name: Dana Holloway MRN: [id number] Date of Birth: October 01, 1934

## 2017-08-03 ENCOUNTER — Ambulatory Visit: Payer: Medicare Other | Admitting: Physical Therapy

## 2017-08-03 ENCOUNTER — Encounter: Payer: Self-pay | Admitting: Physical Therapy

## 2017-08-03 DIAGNOSIS — M6281 Muscle weakness (generalized): Secondary | ICD-10-CM

## 2017-08-03 DIAGNOSIS — M25561 Pain in right knee: Secondary | ICD-10-CM

## 2017-08-03 DIAGNOSIS — R262 Difficulty in walking, not elsewhere classified: Secondary | ICD-10-CM

## 2017-08-03 NOTE — Therapy (Signed)
[facility] [address] [place], [place], [address] Phone: [phone]   Fax:  [phone]  Physical Therapy Treatment  Patient Details  Name: Dana Holloway MRN: [id number] Date of Birth: 03/14/1934 Referring Provider: Dr. Lanell Matar  Encounter Date: 08/03/2017      PT End of Session - 08/03/17 1648    Visit Number 3   Number of Visits 8   Date for PT Re-Evaluation 02-Sep-2017   Authorization Type G codes   Authorization - Visit Number 3   Authorization - Number of Visits 10   PT Start Time 1339   PT Stop Time 1436   PT Time Calculation (min) 57 min   Activity Tolerance Patient tolerated treatment well;No increased pain   Behavior During Therapy WFL for tasks assessed/performed      Past Medical History:  Diagnosis Date  . Arthritis   . Asthma   . Coronary artery disease   . Diabetes mellitus without complication (HCC)   . Gastritis   . Hypertension   . Orbital osteo-periostitis     Past Surgical History:  Procedure Laterality Date  . CARDIAC SURGERY     cardiac cath with stent 2014  . CHOLECYSTECTOMY    . PERCUTANEOUS CORONARY STENT INTERVENTION (PCI-S)      There were no vitals filed for this visit.      Subjective Assessment - 08/03/17 1647    Subjective Pt reports no change since last session; no issues with HEP and no pain at this time.   Patient is accompained by: Family member   Pertinent History 83 y/o female returns to clinic s/p superficial wound breakdown/suture abscess status post initial right knee replacement 05/13/17. Surgical debridement and revision/excision of scar right knee was performed on 06/23/17. Pt has had HHPT that ended on 07/27/17 and now presents to outpatient clinic for continued PT.   Limitations Sitting;Standing;Lifting;Walking;House hold activities   How long can you walk comfortably? 10 min    Patient Stated Goals Increase R knee ROM/ strength/ improve walking.   Currently in Pain? No/denies   Pain Onset More than a month ago       Treatment:  Manual therapy: Hooklying: Patellar mobs. (all planes)- grade III.   Supine: AP femur on tibia with bolster under proximal tibia in max extension 2 x 30 sec bouts Quad set with manual overpressure x 10 with 5 sec hold Mob with movement SAQ over bolster RLE with manual mobilization of patella superioly x  10  Ther-ex:    Nustep L6 10 min. B UE/LE (warm-up/no charge).  Seated/ standing: Hip and knee ex. Program with 2.5# (seated hip flexion/ LAQ/ heel raises/ standing marching/ hip abd./ knee flexion)- 20x each.   Standing: Step ups on 6" step with 1 UE to no UE assist (no ankle wts.)- 20x.   TKE resisted with blue t-band  TKE ball squeezes against wall Supine: SLR/ SAQ with manual resistance/ L sidelying R hip abd. 20x each   HEP advanced with SLR, quad sets, terminal knee extension, and sit<>stand exercises with handout in Spanish.        PT Education - 08/03/17 1648    Education provided Yes   Education Details HEP advanced, ther-ex technique and isolation of movement    Person(s) Educated Patient;Child(ren)   Methods Explanation;Demonstration;Tactile cues;Verbal cues;Handout   Comprehension Verbalized understanding;Returned demonstration;Verbal cues required;Tactile cues required             PT Long Term  Goals - 07/27/17 1200      PT LONG TERM GOAL #1   Title Pt. independent with HEP to increase R knee AROM (0-3-120 deg.) to improve functional mobility.     Baseline R knee AROM: -0-6-112 deg.   Time 4   Period Weeks   Status New   Target Date 08/24/17     PT LONG TERM GOAL #2   Title Pt. will increase LEFS to >45 out of 80 to improve functional mobility at home.     Baseline LEFS: 33 out of 80 on 9/5, 34/80 on 10/24   Time 4   Period Weeks   Status New   Target Date 08/24/17     PT LONG TERM GOAL #3   Title Pt will tolerate walking 20 minutes without having to sit due  to R knee pain or fatigue   Baseline able to walk 10 min before hving to sit   Time 4   Period Weeks   Status New   Target Date 08/24/17     PT LONG TERM GOAL #4   Title Pt. able to complete 4 stairs with recip. gait pattern and use of B UE assist for safety.     Baseline Assess next visit   Time 4   Period Weeks   Status New   Target Date 08/24/17     PT LONG TERM GOAL #5   Title Patient will increase BLE gross strength to 4+/5 as to improve functional strength for independent gait, increased standing tolerance and increased ADL ability.   Baseline hip flexion 4-/5, knee flexion/ext R 4/5, DF R 4/5   Time 4   Period Weeks   Status New   Target Date 08/24/17     Additional Long Term Goals   Additional Long Term Goals Yes     PT LONG TERM GOAL #6   Title Patient will increase 10 meter walk test to >0.10m/s as to improve gait speed for better community ambulation and to reduce fall risk.   Baseline 0.72m/s with SPC   Time 4   Period Weeks   Status New   Target Date 08/24/17     PT LONG TERM GOAL #7   Title Patient (> 54 years old) will complete five times sit to stand test in < 15 seconds indicating an increased LE strength and improved balance.   Baseline 18 sec with no UE assist   Time 4   Period Weeks   Status New   Target Date 08/24/17               Plan - 08/03/17 1653    Clinical Impression       Clinical Presentation Pt had mild increase in pain with there-ex and mobs during session, but reported that the pain was mild. She requires extensive cues for correct technique with there-ex in standing in order to isolate movement and prevent compensation. Pt was able to perform exercise correctly with tactile cues and the assist of her daughter to translate.  Stable   Clinical Decision Making Low   Rehab Potential Good   PT Frequency 2x / week   PT Duration 4 weeks   PT Treatment/Interventions ADLs/Self Care Home Management;Cryotherapy;Moist Heat;Balance  training;Therapeutic exercise;Therapeutic activities;Functional mobility training;Stair training;Gait training;Neuromuscular re-education;Patient/family education;Passive range of motion;Manual techniques;Scar mobilization;Biofeedback;Energy conservation   PT Next Visit Plan Increase R LE strengthening/ independence with gait.     PT Home Exercise Plan LAQ resisted, quad set, SLR, terminal knee extension,  and sit<>stand    Consulted and Agree with Plan of Care Patient      Patient will benefit from skilled therapeutic intervention in order to improve the following deficits and impairments:  Abnormal gait, Pain, Improper body mechanics, Postural dysfunction, Decreased scar mobility, Decreased mobility, Decreased activity tolerance, Decreased endurance, Decreased range of motion, Decreased strength, Hypomobility, Difficulty walking, Decreased safety awareness, Decreased balance, Impaired flexibility  Visit Diagnosis: Acute pain of right knee  Muscle weakness (generalized)  Difficulty in walking, not elsewhere classified     Problem List Patient Active Problem List   Diagnosis Date Noted  . Pulmonary embolism (HCC) 05/24/2017  . COPD with acute exacerbation (HCC) 11/15/2015  . Hypoxia 08/24/2015   Inioluwa Boulay Clydene LamingM Pegah Segel, SPT Cammie McgeeMichael C Sherk, PT, DPT # [id number] 08/03/2017, 8:37 PM  [place] [facility] [address] [place], [place], [address] Phone: [phone]   Fax:  [phone]  Name: Dana Holloway MRN: [id number] Date of Birth: 02-15-1934

## 2017-08-08 ENCOUNTER — Encounter: Payer: Self-pay | Admitting: Physical Therapy

## 2017-08-08 ENCOUNTER — Ambulatory Visit: Payer: Medicare Other | Attending: Orthopedic Surgery | Admitting: Physical Therapy

## 2017-08-08 DIAGNOSIS — R269 Unspecified abnormalities of gait and mobility: Secondary | ICD-10-CM | POA: Diagnosis present

## 2017-08-08 DIAGNOSIS — M25561 Pain in right knee: Secondary | ICD-10-CM

## 2017-08-08 DIAGNOSIS — M6281 Muscle weakness (generalized): Secondary | ICD-10-CM | POA: Diagnosis present

## 2017-08-08 DIAGNOSIS — M25661 Stiffness of right knee, not elsewhere classified: Secondary | ICD-10-CM | POA: Diagnosis present

## 2017-08-08 DIAGNOSIS — Z96651 Presence of right artificial knee joint: Secondary | ICD-10-CM | POA: Insufficient documentation

## 2017-08-08 DIAGNOSIS — R262 Difficulty in walking, not elsewhere classified: Secondary | ICD-10-CM | POA: Diagnosis present

## 2017-08-08 NOTE — Therapy (Addendum)
[facility] [address] [place], [place], [address] Phone: [phone]   Fax:  [phone]  Physical Therapy Treatment  Patient Details  Name: Dana Holloway MRN: [id number] Date of Birth: 03/04/1934 Referring Provider: Dr. Lanell Matarel Gaizo   Encounter Date: 08/08/2017  PT End of Session - 08/08/17 1756    Visit Number  4    Number of Visits  8    Date for PT Re-Evaluation  08/24/17    Authorization Type  G codes    Authorization - Visit Number  4    Authorization - Number of Visits  10    PT Start Time  1348    PT Stop Time  1437    PT Time Calculation (min)  49 min    Equipment Utilized During Treatment  Gait belt    Activity Tolerance  Patient tolerated treatment well;No increased pain;Patient limited by fatigue    Behavior During Therapy  [facility] for tasks assessed/performed       Past Medical History:  Diagnosis Date  . Arthritis   . Asthma   . Coronary artery disease   . Diabetes mellitus without complication (HCC)   . Gastritis   . Hypertension   . Orbital osteo-periostitis     Past Surgical History:  Procedure Laterality Date  . CARDIAC SURGERY     cardiac cath with stent 2014  . CHOLECYSTECTOMY    . PERCUTANEOUS CORONARY STENT INTERVENTION (PCI-S)      There were no vitals filed for this visit.  Subjective Assessment - 08/08/17 1739    Subjective  Pt reports that she is a little better this week, she report no pain at the biginning of session. She has been performing her HEP, though not today.     Patient is accompained by:  Family member    Pertinent History  83 y/o female returns to clinic s/p superficial wound breakdown/suture abscess status post initial right knee replacement 05/13/17. Surgical debridement and revision/excision of scar right knee was performed on 06/23/17. Pt has had HHPT that ended on 07/27/17 and now presents to outpatient clinic for continued PT.    Limitations   Sitting;Standing;Lifting;Walking;House hold activities    How long can you walk comfortably?  10 min     Patient Stated Goals  Increase R knee ROM/ strength/ improve walking.    Currently in Pain?  No/denies    Pain Onset  More than a month ago      R knee AROM in supine: (-7) - 112    Treatment:     Manual therapy: Hooklying: Patellar mobs. (all planes)- grade III.   Supine: Quad set with manual overpressure 2 x 10 with 5 sec hold Mob with movement SAQ (5# ankle weight) over bolster RLE with manual mobilization of patella superiorly x 10   Intructed pt in HEP quad set and in extension stretch using weighted backpack over knee in sitting Ther-ex: Seated:    Hip abd resisted with GTB 2 x 20, feet together      March with 2.5# ankle weight 2 x 20 steps   Gait:  Gait on stairs 10 x 4 steps forward walking, sidestepping each direction (UE assist decreased from BUE assist to SUE, and no railing with SPC); pt instructed in sequencing leading with LLE up the stairs and RLE down the stairs. Instruction for sequencing with SPC  In hallway no AD fast and slow walking x 4 laps of 80'. Pt  was able to change gait speed moderately with LOB x 1 with independent recovery. Pt has equal stride length bilaterally with min heel strike on initial contact. Pt instructed in use of SPC in L hand instead of R with proper sequencing for 2 and 3-point gait.   Tandem walking in // bars with CGA assist with only minor LOB   Nustep L5 5 min. B UE/LE (cool-down /no charge).       PT Education - 08/08/17 1740    Education provided  Yes    Education Details  gait training with SPC, stairs with/without UE assist    Person(s) Educated  Patient;Child(ren)    Methods  Demonstration;Explanation;Tactile cues;Verbal cues    Comprehension  Verbal cues required;Verbalized understanding;Tactile cues required;Returned demonstration          PT Long Term Goals - 07/27/17 1200      PT LONG TERM GOAL #1   Title   Pt. independent with HEP to increase R knee AROM (0-3-120 deg.) to improve functional mobility.      Baseline  R knee AROM: -0-6-112 deg.    Time  4    Period  Weeks    Status  New    Target Date  08/24/17      PT LONG TERM GOAL #2   Title  Pt. will increase LEFS to >45 out of 80 to improve functional mobility at home.      Baseline  LEFS: 33 out of 80 on 9/5, 34/80 on 10/24    Time  4    Period  Weeks    Status  New    Target Date  08/24/17      PT LONG TERM GOAL #3   Title  Pt will tolerate walking 20 minutes without having to sit due to R knee pain or fatigue    Baseline  able to walk 10 min before hving to sit    Time  4    Period  Weeks    Status  New    Target Date  08/24/17      PT LONG TERM GOAL #4   Title  Pt. able to complete 4 stairs with recip. gait pattern and use of B UE assist for safety.      Baseline  Assess next visit    Time  4    Period  Weeks    Status  New    Target Date  08/24/17      PT LONG TERM GOAL #5   Title  Patient will increase BLE gross strength to 4+/5 as to improve functional strength for independent gait, increased standing tolerance and increased ADL ability.    Baseline  hip flexion 4-/5, knee flexion/ext R 4/5, DF R 4/5    Time  4    Period  Weeks    Status  New    Target Date  08/24/17      Additional Long Term Goals   Additional Long Term Goals  Yes      PT LONG TERM GOAL #6   Title  Patient will increase 10 meter walk test to >0.11m/s as to improve gait speed for better community ambulation and to reduce fall risk.    Baseline  0.81m/s with [facility]    Time  4    Period  Weeks    Status  New    Target Date  08/24/17      PT LONG TERM GOAL #7   Title  Patient (> 77 years old) will complete five times sit to stand test in < 15 seconds indicating an increased LE strength and improved balance.    Baseline  18 sec with no UE assist    Time  4    Period  Weeks    Status  New    Target Date  08/24/17            Plan -  08/08/17 1757    Clinical Impression        Clinical Presentation Pt presents with SPC in the wrong hand today. Pt instructed in use of SPC in L hand instead of R with proper sequencing for 2 and 3-point gait. Pt has been instructed in this before. Pt appears to be improving in gait with equal stride length bilaterally (minimally antalgic on RLE) with min heel strike on initial contact. Pt's AROM in R knee is unchanged from last week.   Stable    Clinical Decision Making  Low    Rehab Potential  Good    PT Frequency  2x / week    PT Duration  4 weeks    PT Treatment/Interventions  ADLs/Self Care Home Management;Cryotherapy;Moist Heat;Balance training;Therapeutic exercise;Therapeutic activities;Functional mobility training;Stair training;Gait training;Neuromuscular re-education;Patient/family education;Passive range of motion;Manual techniques;Scar mobilization;Biofeedback;Energy conservation    PT Next Visit Plan  Gait trianing with SPC, stairs, Increase R LE strengthening/ independence with gait.      PT Home Exercise Plan  LAQ resisted, quad set, SLR, terminal knee extension, and sit<>stand     Consulted and Agree with Plan of Care  Patient       Patient will benefit from skilled therapeutic intervention in order to improve the following deficits and impairments:  Abnormal gait, Pain, Improper body mechanics, Postural dysfunction, Decreased scar mobility, Decreased mobility, Decreased activity tolerance, Decreased endurance, Decreased range of motion, Decreased strength, Hypomobility, Difficulty walking, Decreased safety awareness, Decreased balance, Impaired flexibility  Visit Diagnosis: Acute pain of right knee  Muscle weakness (generalized)  Difficulty in walking, not elsewhere classified     Problem List Patient Active Problem List   Diagnosis Date Noted  . Pulmonary embolism (HCC) 05/24/2017  . COPD with acute exacerbation (HCC) 11/15/2015  . Hypoxia 08/24/2015    Joneisha Miles Clydene Laming, SPT Cammie Mcgee, PT, DPT # [id number] 08/09/2017, 7:27 AM  [facility] [address] [place], [place], [address] Phone: [phone]   Fax:  [phone]  Name: Vanessia Bokhari MRN: [id number] Date of Birth: 1934/02/02

## 2017-08-09 ENCOUNTER — Other Ambulatory Visit: Payer: Self-pay | Admitting: Obstetrics and Gynecology

## 2017-08-09 DIAGNOSIS — I2699 Other pulmonary embolism without acute cor pulmonale: Secondary | ICD-10-CM

## 2017-08-10 ENCOUNTER — Ambulatory Visit: Payer: Medicare Other | Admitting: Physical Therapy

## 2017-08-10 ENCOUNTER — Encounter: Payer: Self-pay | Admitting: Physical Therapy

## 2017-08-10 DIAGNOSIS — M6281 Muscle weakness (generalized): Secondary | ICD-10-CM

## 2017-08-10 DIAGNOSIS — R262 Difficulty in walking, not elsewhere classified: Secondary | ICD-10-CM

## 2017-08-10 DIAGNOSIS — M25561 Pain in right knee: Secondary | ICD-10-CM | POA: Diagnosis not present

## 2017-08-10 NOTE — Therapy (Signed)
[place] [facility] [address] [place], [place], [address] Phone: [phone]   Fax:  [phone]  Physical Therapy Treatment  Patient Details  Name: Dana Holloway MRN: [id number] Date of Birth: 12/15/1933 Referring Provider: Dr. Lanell Matarel Gaizo   Encounter Date: 08/10/2017  PT End of Session - 08/10/17 1439    Visit Number  5    Number of Visits  8    Date for PT Re-Evaluation  08/24/17    Authorization Type  G codes    Authorization - Visit Number  5    Authorization - Number of Visits  10    PT Start Time  1342    PT Stop Time  1431    PT Time Calculation (min)  49 min    Equipment Utilized During Treatment  Gait belt    Activity Tolerance  Patient tolerated treatment well;No increased pain;Patient limited by fatigue    Behavior During Therapy  [facility] for tasks assessed/performed       Past Medical History:  Diagnosis Date  . Arthritis   . Asthma   . Coronary artery disease   . Diabetes mellitus without complication (HCC)   . Gastritis   . Hypertension   . Orbital osteo-periostitis     Past Surgical History:  Procedure Laterality Date  . CARDIAC SURGERY     cardiac cath with stent 2014  . CHOLECYSTECTOMY    . PERCUTANEOUS CORONARY STENT INTERVENTION (PCI-S)      There were no vitals filed for this visit.  Subjective Assessment - 08/10/17 1352    Subjective  Pt reports she did not take any pain meds today and is having a little more knee pain medially and posteriorly on R    Patient is accompained by:  Family member    Pertinent History  83 y/o female returns to clinic s/p superficial wound breakdown/suture abscess status post initial right knee replacement 05/13/17. Surgical debridement and revision/excision of scar right knee was performed on 06/23/17. Pt has had HHPT that ended on 07/27/17 and now presents to outpatient clinic for continued PT.    Limitations  Sitting;Standing;Lifting;Walking;House hold activities     How long can you walk comfortably?  10 min     Patient Stated Goals  Increase R knee ROM/ strength/ improve walking.    Pain Score  4     Pain Location  Knee    Pain Orientation  Right    Pain Descriptors / Indicators  Aching    Pain Type  Surgical pain    Pain Onset  More than a month ago    Pain Frequency  Intermittent    Aggravating Factors   Max flexion, walking    Pain Relieving Factors  Pain meds, tylenol     Effect of Pain on Daily Activities  Decreased QoL          Treatment:    08/08/17: R knee AROM in supine: (-7) - 112   Manual therapy: Hooklying:     Crossfrisction massage over surgical incision in anterior knee; pt reported    mild pain, worse in middle of incision  Patellar mobs. (all planes)- grade III. Mild hypomobility noted, pt with minor      pain with lateral mobilization Supine: Quad set with manual overpressure 1 x 10 with 5 sec hold Mob with movement RLE SAQ (5# ankle weight) over bolster with superior patellar mob 2 x 10   Ther-ex: Seated:  LAQ resisted with green Tband alternating LEs x 15 each side           Hip abd resisted with GTB 1 x 20, feet together     Hamstring curls x 20 each LE resisted with BTB    Adductor ball squeezes x 20 with 3 sec hold Standing in // bars   Slow march with 2.5# ankle weights      Sidestep resisted x 4 laps with BUE assist     Hip ext resisted x 20 each LE     Lateral and forwards step-ups 2 x 10 each   (Pt reported no increase in pain during treatment)    PT Education - 08/10/17 1438    Education provided  Yes    Education Details  HEP modified slightly, exercise technique     Person(s) Educated  Patient    Methods  Explanation;Demonstration;Tactile cues;Verbal cues    Comprehension  Verbal cues required;Returned demonstration;Verbalized understanding;Tactile cues required          PT Long Term Goals - 07/27/17 1200      PT LONG TERM GOAL #1   Title  Pt. independent with HEP to increase R knee  AROM (0-3-120 deg.) to improve functional mobility.      Baseline  R knee AROM: -0-6-112 deg.    Time  4    Period  Weeks    Status  New    Target Date  08/24/17      PT LONG TERM GOAL #2   Title  Pt. will increase LEFS to >45 out of 80 to improve functional mobility at home.      Baseline  LEFS: 33 out of 80 on 9/5, 34/80 on 10/24    Time  4    Period  Weeks    Status  New    Target Date  08/24/17      PT LONG TERM GOAL #3   Title  Pt will tolerate walking 20 minutes without having to sit due to R knee pain or fatigue    Baseline  able to walk 10 min before hving to sit    Time  4    Period  Weeks    Status  New    Target Date  08/24/17      PT LONG TERM GOAL #4   Title  Pt. able to complete 4 stairs with recip. gait pattern and use of B UE assist for safety.      Baseline  Assess next visit    Time  4    Period  Weeks    Status  New    Target Date  08/24/17      PT LONG TERM GOAL #5   Title  Patient will increase BLE gross strength to 4+/5 as to improve functional strength for independent gait, increased standing tolerance and increased ADL ability.    Baseline  hip flexion 4-/5, knee flexion/ext R 4/5, DF R 4/5    Time  4    Period  Weeks    Status  New    Target Date  08/24/17      Additional Long Term Goals   Additional Long Term Goals  Yes      PT LONG TERM GOAL #6   Title  Patient will increase 10 meter walk test to >0.97m/s as to improve gait speed for better community ambulation and to reduce fall risk.    Baseline  0.73m/s with [facility]  Time  4    Period  Weeks    Status  New    Target Date  08/24/17      PT LONG TERM GOAL #7   Title  Patient (> 81 years old) will complete five times sit to stand test in < 15 seconds indicating an increased LE strength and improved balance.    Baseline  18 sec with no UE assist    Time  4    Period  Weeks    Status  New    Target Date  08/24/17            Plan - 08/10/17 1445    Clinical Impression Statement   Pt had slightly more R knee pain today during session. She reports that she did not take any pain medication. Pt required frequent rest breaks today due to fatigue. Pt had mild SOB in supine and with ther-ex; O2 sats taken in sitting during exercise 94-96% with HR 80BPM; pt asymptomatic. Increased warmth noted in R knee; pt denies having fever or any systemic symptoms. No redness noted. Pt reports her HEP is going well.    Clinical Presentation  Stable    Clinical Decision Making  Low    Rehab Potential  Good    PT Frequency  2x / week    PT Duration  4 weeks    PT Treatment/Interventions  ADLs/Self Care Home Management;Cryotherapy;Moist Heat;Balance training;Therapeutic exercise;Therapeutic activities;Functional mobility training;Stair training;Gait training;Neuromuscular re-education;Patient/family education;Passive range of motion;Manual techniques;Scar mobilization;Biofeedback;Energy conservation    PT Next Visit Plan  Gait trianing with SPC, obstacle course, stairs, mini-squats, reisted gait, Increase R LE strengthening/ independence with gait.      PT Home Exercise Plan  LAQ resisted, hamstring curl, quad set, SLR, terminal knee extension, and sit<>stand     Consulted and Agree with Plan of Care  Patient       Patient will benefit from skilled therapeutic intervention in order to improve the following deficits and impairments:  Abnormal gait, Pain, Improper body mechanics, Postural dysfunction, Decreased scar mobility, Decreased mobility, Decreased activity tolerance, Decreased endurance, Decreased range of motion, Decreased strength, Hypomobility, Difficulty walking, Decreased safety awareness, Decreased balance, Impaired flexibility  Visit Diagnosis: Acute pain of right knee  Muscle weakness (generalized)  Difficulty in walking, not elsewhere classified     Problem List Patient Active Problem List   Diagnosis Date Noted  . Pulmonary embolism (HCC) 05/24/2017  . COPD with acute  exacerbation (HCC) 11/15/2015  . Hypoxia 08/24/2015   Deandra Goering Clydene LamingM Teddie Mehta, SPT Cammie McgeeMichael C Sherk, PT, DPT # [id number] 08/10/2017, 5:31 PM  [place] [facility] [address] [place], [place], [address] Phone: [phone]   Fax:  [phone]  Name: Dana Holloway MRN: [id number] Date of Birth: 05/25/1934

## 2017-08-15 ENCOUNTER — Encounter: Payer: Self-pay | Admitting: Physical Therapy

## 2017-08-15 ENCOUNTER — Ambulatory Visit: Payer: Medicare Other | Admitting: Physical Therapy

## 2017-08-15 DIAGNOSIS — Z96651 Presence of right artificial knee joint: Secondary | ICD-10-CM

## 2017-08-15 DIAGNOSIS — M25561 Pain in right knee: Secondary | ICD-10-CM | POA: Diagnosis not present

## 2017-08-15 DIAGNOSIS — M25661 Stiffness of right knee, not elsewhere classified: Secondary | ICD-10-CM

## 2017-08-15 DIAGNOSIS — M6281 Muscle weakness (generalized): Secondary | ICD-10-CM

## 2017-08-15 DIAGNOSIS — R262 Difficulty in walking, not elsewhere classified: Secondary | ICD-10-CM

## 2017-08-15 NOTE — Therapy (Signed)
[employer] [facility] [address] [place], [place], [address] Phone: [phone]   Fax:  [phone]  Physical Therapy Treatment  Patient Details  Name: Dana Holloway MRN: [id number] Date of Birth: 1934/07/22 Referring Provider: Dr. Lanell Matar   Encounter Date: 08/15/2017  PT End of Session - 08/15/17 1351    Visit Number  6    Number of Visits  8    Date for PT Re-Evaluation  08/24/17    Authorization Type  G codes    Authorization - Visit Number  6    Authorization - Number of Visits  10    PT Start Time  1344    PT Stop Time  1439    PT Time Calculation (min)  55 min    Activity Tolerance  Patient tolerated treatment well;No increased pain;Patient limited by fatigue    Behavior During Therapy  [facility] for tasks assessed/performed       Past Medical History:  Diagnosis Date  . Arthritis   . Asthma   . Coronary artery disease   . Diabetes mellitus without complication (HCC)   . Gastritis   . Hypertension   . Orbital osteo-periostitis     Past Surgical History:  Procedure Laterality Date  . CARDIAC SURGERY     cardiac cath with stent 2014  . CHOLECYSTECTOMY    . PERCUTANEOUS CORONARY STENT INTERVENTION (PCI-S)      There were no vitals filed for this visit.  Subjective Assessment - 08/15/17 1351    Subjective  Pt. did well over the weekend.  No new complaints.      Patient is accompained by:  Family member    Pertinent History  83 y/o female returns to clinic s/p superficial wound breakdown/suture abscess status post initial right knee replacement 05/13/17. Surgical debridement and revision/excision of scar right knee was performed on 06/23/17. Pt has had HHPT that ended on 07/27/17 and now presents to outpatient clinic for continued PT.    Limitations  Sitting;Standing;Lifting;Walking;House hold activities    Patient Stated Goals  Increase R knee ROM/ strength/ improve walking.    Currently in Pain?  No/denies        Treatment:  Manual therapy: Supine: Quad set with manual overpressure10x with >5 sec hold (as tolerated). Patellar mobs. (all planes) 4 min.  Supine R prox. Tibia AP grade III mobs. 3x30 sec.     Ther-ex:  Nustep L6 10 min.   Walking in clinic with no assistive device/ 7# box floor to waist lifting and B carrying in clinic 3x.   Step ups/down with no UE assist at stairs (cuing for head position).    Seated:                       2.5# LAQ resisted alternating LEs x 15 each side                        Adductor ball squeezes x 20 with 3 sec hold Standing in // bars                       Slow march with 2.5# ankle weights                         Sidestep resisted x 4 laps with BUE assist  Hip ext resisted x 20 each LE                        Lateral and forwards step-ups 2 x 10 each   1 short seated rest break.  Pt. Reports B LE muscle fatigue but no increase c/o pain.      PT Long Term Goals - 07/27/17 1200      PT LONG TERM GOAL #1   Title  Pt. independent with HEP to increase R knee AROM (0-3-120 deg.) to improve functional mobility.      Baseline  R knee AROM: -0-6-112 deg.    Time  4    Period  Weeks    Status  New    Target Date  08/24/17      PT LONG TERM GOAL #2   Title  Pt. will increase LEFS to >45 out of 80 to improve functional mobility at home.      Baseline  LEFS: 33 out of 80 on 9/5, 34/80 on 10/24    Time  4    Period  Weeks    Status  New    Target Date  08/24/17      PT LONG TERM GOAL #3   Title  Pt will tolerate walking 20 minutes without having to sit due to R knee pain or fatigue    Baseline  able to walk 10 min before hving to sit    Time  4    Period  Weeks    Status  New    Target Date  08/24/17      PT LONG TERM GOAL #4   Title  Pt. able to complete 4 stairs with recip. gait pattern and use of B UE assist for safety.      Baseline  Assess next visit    Time  4    Period  Weeks    Status  New    Target Date   08/24/17      PT LONG TERM GOAL #5   Title  Patient will increase BLE gross strength to 4+/5 as to improve functional strength for independent gait, increased standing tolerance and increased ADL ability.    Baseline  hip flexion 4-/5, knee flexion/ext R 4/5, DF R 4/5    Time  4    Period  Weeks    Status  New    Target Date  08/24/17      Additional Long Term Goals   Additional Long Term Goals  Yes      PT LONG TERM GOAL #6   Title  Patient will increase 10 meter walk test to >0.6114m/s as to improve gait speed for better community ambulation and to reduce fall risk.    Baseline  0.4635m/s with SPC    Time  4    Period  Weeks    Status  New    Target Date  08/24/17      PT LONG TERM GOAL #7   Title  Patient (> 81 years old) will complete five times sit to stand test in < 15 seconds indicating an increased LE strength and improved balance.    Baseline  18 sec with no UE assist    Time  4    Period  Weeks    Status  New    Target Date  08/24/17         Plan - 08/15/17 1352    Clinical Impression  Statement  No increase c/o R knee pain during R knee AA/PROM/ mobs.  Good R prox. tibia AP mobs. with knee extension to <5 deg.  Good R SLR today.  Pt. works hard with PT to increase B LE muscle strength/ independence with walking in clinic.  Pt. able to ambulate safely on level surfaces with no assistive device and consistent step pattern.  Pt. will continue with HEP and daily walking program at this time.         Clinical Presentation  Stable    Clinical Decision Making  Low    Rehab Potential  Good    PT Frequency  2x / week    PT Duration  4 weeks    PT Treatment/Interventions  ADLs/Self Care Home Management;Cryotherapy;Moist Heat;Balance training;Therapeutic exercise;Therapeutic activities;Functional mobility training;Stair training;Gait training;Neuromuscular re-education;Patient/family education;Passive range of motion;Manual techniques;Scar mobilization;Biofeedback;Energy  conservation    PT Next Visit Plan  Gait trianing with SPC, obstacle course, stairs, mini-squats, reisted gait, Increase R LE strengthening/ independence with gait.      PT Home Exercise Plan  LAQ resisted, hamstring curl, quad set, SLR, terminal knee extension, and sit<>stand     Consulted and Agree with Plan of Care  Patient       Patient will benefit from skilled therapeutic intervention in order to improve the following deficits and impairments:  Abnormal gait, Pain, Improper body mechanics, Postural dysfunction, Decreased scar mobility, Decreased mobility, Decreased activity tolerance, Decreased endurance, Decreased range of motion, Decreased strength, Hypomobility, Difficulty walking, Decreased safety awareness, Decreased balance, Impaired flexibility  Visit Diagnosis: Acute pain of right knee  Muscle weakness (generalized)  Difficulty in walking, not elsewhere classified  History of total knee replacement, right  Joint stiffness of knee, right     Problem List Patient Active Problem List   Diagnosis Date Noted  . Pulmonary embolism (HCC) 05/24/2017  . COPD with acute exacerbation (HCC) 11/15/2015  . Hypoxia 08/24/2015   Cammie McgeeMichael C Anique Beckley, PT, DPT # [id number] 08/16/2017, 5:47 PM  [place] [facility] [address] [place], [place], [address] Phone: [phone]   Fax:  [phone]  Name: Dana Holloway MRN: [id number] Date of Birth: October 27, 1933

## 2017-08-17 ENCOUNTER — Ambulatory Visit: Payer: Medicare Other | Admitting: Physical Therapy

## 2017-08-17 ENCOUNTER — Encounter: Payer: Self-pay | Admitting: Physical Therapy

## 2017-08-17 DIAGNOSIS — Z96651 Presence of right artificial knee joint: Secondary | ICD-10-CM

## 2017-08-17 DIAGNOSIS — M25661 Stiffness of right knee, not elsewhere classified: Secondary | ICD-10-CM

## 2017-08-17 DIAGNOSIS — M25561 Pain in right knee: Secondary | ICD-10-CM

## 2017-08-17 DIAGNOSIS — R262 Difficulty in walking, not elsewhere classified: Secondary | ICD-10-CM

## 2017-08-17 DIAGNOSIS — R269 Unspecified abnormalities of gait and mobility: Secondary | ICD-10-CM

## 2017-08-17 DIAGNOSIS — M6281 Muscle weakness (generalized): Secondary | ICD-10-CM

## 2017-08-17 NOTE — Therapy (Signed)
[facility] [address] [place], [place], [address] Phone: [phone]   Fax:  [phone]  Physical Therapy Treatment  Patient Details  Name: Dana SaasFaviana Franco de Holloway MRN: [id number] Date of Birth: [birth year] Referring Provider: Dr. Lanell Matarel Gaizo   Encounter Date: 08/17/2017  PT End of Session - 08/17/17 1445    Visit Number  7    Number of Visits  8    Date for PT Re-Evaluation  08/24/17    Authorization Type  G codes    Authorization - Visit Number  7    Authorization - Number of Visits  10    PT Start Time  1336    PT Stop Time  1432    PT Time Calculation (min)  56 min    Activity Tolerance  Patient tolerated treatment well;No increased pain;Patient limited by fatigue    Behavior During Therapy  [facility] for tasks assessed/performed       Past Medical History:  Diagnosis Date  . Arthritis   . Asthma   . Coronary artery disease   . Diabetes mellitus without complication (HCC)   . Gastritis   . Hypertension   . Orbital osteo-periostitis     Past Surgical History:  Procedure Laterality Date  . CARDIAC SURGERY     cardiac cath with stent 2014  . CHOLECYSTECTOMY    . PERCUTANEOUS CORONARY STENT INTERVENTION (PCI-S)      There were no vitals filed for this visit.  Subjective Assessment - 08/17/17 1442    Subjective  Pt reports mild medial knee pain at start of session, but reports that overall she is doing a little better than last week. Pt continues to have numbness/tingling in lateral R thigh, but reports this is not new.    Patient is accompained by:  Family member    Pertinent History  83 y/o female returns to clinic s/p superficial wound breakdown/suture abscess status post initial right knee replacement 05/13/17. Surgical debridement and revision/excision of scar right knee was performed on 06/23/17. Pt has had HHPT that ended on 07/27/17 and now presents to outpatient clinic for continued PT.    Limitations   Sitting;Standing;Lifting;Walking;House hold activities    How long can you walk comfortably?  10 min     Patient Stated Goals  Increase R knee ROM/ strength/ improve walking.    Currently in Pain?  Yes    Pain Score  2     Pain Location  Knee    Pain Orientation  Right;Medial    Pain Descriptors / Indicators  Aching    Pain Type  Surgical pain    Pain Onset  More than a month ago    Pain Frequency  Intermittent    Aggravating Factors   Walking, end range flexion/extension     Pain Relieving Factors  Rest, meds    Effect of Pain on Daily Activities  Decreased QoL       R knee AROM -5 to 116 in supine; bolster under ankle for Ext measurement    Treatment:  Manual therapy: Supine: Quad set with manual overpressure10 x with >5 sec hold (as tolerated). Manually resisted SLR 2 x 10 each side  Manually resisted clamshell 2 x 10  Gait      Stairs 3 x 4 steps up/down laterally to each side with BUE asist     3 x 4 steps up/dow with progression BUE assist to no UE assist; min cues for  sequencing (up with L, down with R), and for increased eccentric control. Pt had no LOB  when descending stairs with no UE assist, but had decreased eccentric control and increased compensatory trunk rotation  Ther-ex:  Seated:  5# LAQ resisted alternating LEs x 15 each side  Adductor ball squeezesx 10 with 5 sec hold    March 5# ankle weights x 20 with 2 sec hold      Hip abd resisted with GTB x 20 with 2 sec hold Standing in // bars  Sidestep resistedx 4 laps with BUE assist     Mini-squat 3 x 10 to chair with no UE assist   Ankle rocking on BOSU ball in DF/PF x 10 each with 3 sec holds, and laterally with 3 sec      hold x 10 each side, min UE assist; pt required minA x 2-3 due to LOB posteriorly.    PT Education - 08/17/17 1444    Education provided  Yes    Education Details  HEP updated    Person(s) Educated  Patient    Methods   Explanation;Demonstration;Tactile cues;Verbal cues;Handout    Comprehension  Verbalized understanding;Tactile cues required;Returned demonstration;Verbal cues required          PT Long Term Goals - 07/27/17 1200      PT LONG TERM GOAL #1   Title  Pt. independent with HEP to increase R knee AROM (0-3-120 deg.) to improve functional mobility.      Baseline  R knee AROM: -0-6-112 deg.    Time  4    Period  Weeks    Status  New    Target Date  08/24/17      PT LONG TERM GOAL #2   Title  Pt. will increase LEFS to >45 out of 80 to improve functional mobility at home.      Baseline  LEFS: 33 out of 80 on 9/5, 34/80 on 10/24    Time  4    Period  Weeks    Status  New    Target Date  08/24/17      PT LONG TERM GOAL #3   Title  Pt will tolerate walking 20 minutes without having to sit due to R knee pain or fatigue    Baseline  able to walk 10 min before hving to sit    Time  4    Period  Weeks    Status  New    Target Date  08/24/17      PT LONG TERM GOAL #4   Title  Pt. able to complete 4 stairs with recip. gait pattern and use of B UE assist for safety.      Baseline  Assess next visit    Time  4    Period  Weeks    Status  New    Target Date  08/24/17      PT LONG TERM GOAL #5   Title  Patient will increase BLE gross strength to 4+/5 as to improve functional strength for independent gait, increased standing tolerance and increased ADL ability.    Baseline  hip flexion 4-/5, knee flexion/ext R 4/5, DF R 4/5    Time  4    Period  Weeks    Status  New    Target Date  08/24/17      Additional Long Term Goals   Additional Long Term Goals  Yes      PT LONG TERM GOAL #6  Title  Patient will increase 10 meter walk test to >0.62m/s as to improve gait speed for better community ambulation and to reduce fall risk.    Baseline  0.61m/s with SPC    Time  4    Period  Weeks    Status  New    Target Date  08/24/17      PT LONG TERM GOAL #7   Title  Patient (> 22 years old)  will complete five times sit to stand test in < 15 seconds indicating an increased LE strength and improved balance.    Baseline  18 sec with no UE assist    Time  4    Period  Weeks    Status  New    Target Date  08/24/17            Plan - 08/17/17 1446    Clinical Impression Statement  Pt presents to therapy walking without her cane. Pt is progressing functionally with there-ex, though she is still limited in activity tolerance and requires frequent sitting rest breaks. Pt was able to climb and descent steps today with no UE support using step-to gait pattern with SBA. Pt.s R knee AROM is slightly improved at -5 to 116 deg after manual therapy.     Clinical Presentation  Stable    Clinical Decision Making  Low    Rehab Potential  Good    PT Frequency  2x / week    PT Duration  4 weeks    PT Treatment/Interventions  ADLs/Self Care Home Management;Cryotherapy;Moist Heat;Balance training;Therapeutic exercise;Therapeutic activities;Functional mobility training;Stair training;Gait training;Neuromuscular re-education;Patient/family education;Passive range of motion;Manual techniques;Scar mobilization;Biofeedback;Energy conservation    PT Next Visit Plan  Gait trianing with SPC, stairs, squats, obstacle course, stairs, mini-squats, reisted gait, Increase R LE strengthening/ independence with gait.      PT Home Exercise Plan  Added mini-squat to chair, LAQ resisted, hamstring curl, quad set, SLR, terminal knee extension, and sit<>stand     Consulted and Agree with Plan of Care  Patient       Patient will benefit from skilled therapeutic intervention in order to improve the following deficits and impairments:  Abnormal gait, Pain, Improper body mechanics, Postural dysfunction, Decreased scar mobility, Decreased mobility, Decreased activity tolerance, Decreased endurance, Decreased range of motion, Decreased strength, Hypomobility, Difficulty walking, Decreased safety awareness, Decreased  balance, Impaired flexibility  Visit Diagnosis: Acute pain of right knee  Muscle weakness (generalized)  Difficulty in walking, not elsewhere classified  History of total knee replacement, right  Joint stiffness of knee, right  Gait difficulty     Problem List Patient Active Problem List   Diagnosis Date Noted  . Pulmonary embolism (HCC) 05/24/2017  . COPD with acute exacerbation (HCC) 11/15/2015  . Hypoxia 08/24/2015   Konni Kesinger Clydene Laming, SPT Cammie Mcgee, PT, DPT # [phone] 08/18/2017, 8:27 AM  [facility] [address] [place], [place], [address] Phone: [phone]   Fax:  [phone]  Name: Gazelle Towe MRN: [id number] Date of Birth: 1934/01/14

## 2017-08-18 ENCOUNTER — Telehealth: Payer: Self-pay | Admitting: Obstetrics and Gynecology

## 2017-08-19 ENCOUNTER — Ambulatory Visit: Admission: RE | Admit: 2017-08-19 | Payer: Medicare Other | Source: Ambulatory Visit

## 2017-08-22 ENCOUNTER — Ambulatory Visit: Payer: Medicare Other | Admitting: Physical Therapy

## 2017-08-22 ENCOUNTER — Encounter: Payer: Self-pay | Admitting: Physical Therapy

## 2017-08-22 DIAGNOSIS — M6281 Muscle weakness (generalized): Secondary | ICD-10-CM

## 2017-08-22 DIAGNOSIS — M25561 Pain in right knee: Secondary | ICD-10-CM

## 2017-08-22 DIAGNOSIS — R262 Difficulty in walking, not elsewhere classified: Secondary | ICD-10-CM

## 2017-08-22 DIAGNOSIS — Z96651 Presence of right artificial knee joint: Secondary | ICD-10-CM

## 2017-08-22 NOTE — Therapy (Signed)
[facility] [address] [place], [place], [address] Phone: [phone]   Fax:  [phone]  Physical Therapy Treatment/ Discharge  Patient Details  Name: Dana Holloway MRN: [id number] Date of Birth: 1933-12-24 Referring Provider: Dr. Rozelle Logan   Encounter Date: 08/22/2017  PT End of Session - 08/22/17 1449    Visit Number  8    Number of Visits  8    Date for PT Re-Evaluation  08/24/17    Authorization Type  G codes    Authorization - Visit Number  8    Authorization - Number of Visits  10    PT Start Time  2595    PT Stop Time  6387    PT Time Calculation (min)  51 min    Activity Tolerance  Patient tolerated treatment well;No increased pain;Patient limited by fatigue    Behavior During Therapy  [facility] for tasks assessed/performed       Past Medical History:  Diagnosis Date  . Arthritis   . Asthma   . Coronary artery disease   . Diabetes mellitus without complication ([place])   . Gastritis   . Hypertension   . Orbital osteo-periostitis     Past Surgical History:  Procedure Laterality Date  . CARDIAC SURGERY     cardiac cath with stent 2014  . CHOLECYSTECTOMY    . PERCUTANEOUS CORONARY STENT INTERVENTION (PCI-S)      There were no vitals filed for this visit.  Subjective Assessment - 08/22/17 1358    Subjective  Pt reports she is doing a little better; pain 3/10 with exercise and less pain at rest in R medial knee. No pain at beginning of session.    Patient is accompained by:  Family member    Pertinent History  83 y/o female returns to clinic s/p superficial wound breakdown/suture abscess status post initial right knee replacement 05/13/17. Surgical debridement and revision/excision of scar right knee was performed on 06/23/17. Pt has had HHPT that ended on 07/27/17 and now presents to outpatient clinic for continued PT.    Limitations  Sitting;Standing;Lifting;Walking;House hold activities    How long can  you walk comfortably?  10 min     Patient Stated Goals  Increase R knee ROM/ strength/ improve walking.    Currently in Pain?  No/denies    Pain Onset  More than a month ago        R knee AROM -4 to 120 in supine; bolster under ankle for Ext measurement    Treatment:   SciFit warm-up x 10 min L 8 BUE/BLE  Goals Re-assessed:     Gait: Speed improved to 0.30ms from 0.647m on 10/24 with SCP    5x sit<>stand: 10 sec improved from 18 sec on 10/24   R knee AROM: (-4) to 120 deg 08/22/17 (improved from  ((-6) to 112 deg. 10/24    LEFS: Improved from 34/80 on 10/24 to 57/80 currently  Gait:       Resisted gait with BTB sidestepping laterally x 5 laps each direction in // bars with no UE assist; pt with no LOB   Ther-ex:   Seated:             2.5# LAQ resisted alternating LEs x 20 each side             March 2.5# ankle weights x 20 with 2 sec hold Standing in // bars:   Hip abd x 20 with  BUE assist  Hip flexion x 20 with BUE assist  March x 20 alternating with 2.5# ankle weights with BUE assist      PT Education - 08/22/17 1448    Education provided  Yes    Education Details  Progress towards goals, HEP discussed    Person(s) Educated  Patient    Methods  Explanation;Demonstration;Tactile cues;Verbal cues    Comprehension  Verbalized understanding;Returned demonstration;Verbal cues required;Tactile cues required          PT Long Term Goals - 08/22/17 1405      PT LONG TERM GOAL #1   Title  Pt. independent with HEP to increase R knee AROM (0-3-120 deg.) to improve functional mobility.      Baseline  R knee AROM: -0-6-112 deg.       (-4) - 120 deg 08/22/17    Time  4    Period  Weeks    Status  Partially Met    Target Date  08/24/17      PT LONG TERM GOAL #2   Title  Pt. will increase LEFS to >45 out of 80 to improve functional mobility at home.      Baseline  LEFS: 33 out of 80 on 9/5, 34/80 on 10/24,        57/80 on 08/22/17    Time  4    Period  Weeks    Status   Achieved      PT LONG TERM GOAL #3   Title  Pt will tolerate walking 20 minutes without having to sit due to R knee pain or fatigue    Baseline  able to walk 10 min before hving to sit    Time  4    Period  Weeks    Status  Partially Met    Target Date  08/24/17      PT LONG TERM GOAL #4   Title  Pt. able to complete 4 stairs with recip. gait pattern and use of B UE assist for safety.      Baseline  Pt able to climb 4 steps with no UE assist, SUE assist requred for descending steps    Time  4    Period  Weeks    Status  Achieved    Target Date  08/24/17      PT LONG TERM GOAL #5   Title  Patient will increase BLE gross strength to 4+/5 as to improve functional strength for independent gait, increased standing tolerance and increased ADL ability.    Baseline  hip flexion R 4-/5, L 4/5, knee flexion/ext R 4+/5, DF R 4+/5    Time  4    Period  Weeks    Status  Partially Met    Target Date  08/24/17      PT LONG TERM GOAL #6   Title  Patient will increase 10 meter walk test to >0.40ms as to improve gait speed for better community ambulation and to reduce fall risk.    Baseline  0.619m with SPC,    0.9532mon 11/19 with SPC    Time  4    Period  Weeks    Status  Achieved    Target Date  08/24/17      PT LONG TERM GOAL #7   Title  Patient (> 60 53ars old) will complete five times sit to stand test in < 15 seconds indicating an increased LE strength and improved balance.    Baseline  18 sec with no UE assist     10 sec no UEs, 2017-09-08    Time  4    Period  Weeks    Status  Achieved    Target Date  08/24/17         Plan - 09/08/17 1450    Clinical Impression Statement  Pt's goals re-assessed today: pt has made improvements towards all goals with all goals met except for walking time goal, strength goal, and AROM goal. Pt was only one degree from meeting her R knee extension goal. Strength has all improved, only hip flexor strength is lacking. 53mwalk test indicates pt is a  community level ambulator. 5x sit<> stand indicates pt has improved LE strength. LEFS indicates pt has improved significantly in LE function. Pt is able to independently perform her HEP in order to progress towards her remaining goals. Pt is appropriate for D/C at this time.     Clinical Presentation  Stable    Clinical Decision Making  Low    Rehab Potential  Good    PT Frequency  2x / week    PT Duration  4 weeks    PT Treatment/Interventions  ADLs/Self Care Home Management;Cryotherapy;Moist Heat;Balance training;Therapeutic exercise;Therapeutic activities;Functional mobility training;Stair training;Gait training;Neuromuscular re-education;Patient/family education;Passive range of motion;Manual techniques;Scar mobilization;Biofeedback;Energy conservation    PT Next Visit Plan  D/C    PT Home Exercise Plan  Continued mini-squat to chair, LAQ resisted, hamstring curl, quad set, SLR, terminal knee extension, and sit<>stand     Consulted and Agree with Plan of Care  Patient       Patient will benefit from skilled therapeutic intervention in order to improve the following deficits and impairments:  Abnormal gait, Pain, Improper body mechanics, Postural dysfunction, Decreased scar mobility, Decreased mobility, Decreased activity tolerance, Decreased endurance, Decreased range of motion, Decreased strength, Hypomobility, Difficulty walking, Decreased safety awareness, Decreased balance, Impaired flexibility  Visit Diagnosis: Acute pain of right knee  Muscle weakness (generalized)  Difficulty in walking, not elsewhere classified  History of total knee replacement, right   G-Codes - 112/06/20181703    Functional Assessment Tool Used (Outpatient Only)  LEFS, 5xSTS, 115m, MMT, ROM, clinical judgement    Functional Limitation  Mobility: Walking and moving around    Mobility: Walking and Moving Around Current Status ([id number] At least 1 percent but less than 20 percent impaired, limited or restricted     Mobility: Walking and Moving Around Goal Status ([id number] At least 1 percent but less than 20 percent impaired, limited or restricted    Mobility: Walking and Moving Around Discharge Status ([id number] At least 1 percent but less than 20 percent impaired, limited or restricted       Problem List Patient Active Problem List   Diagnosis Date Noted  . Pulmonary embolism ([facility]08/21/2018  . COPD with acute exacerbation ([facility]02/08/2016  . Hypoxia 08/24/2015    Dana Holloway M Lenis DickinsonSPT MiPura SpicePT, DPT # [phone]/20/2018, 8:05 AM  [facility]: [phone] Fax:  [phone]: Dana ElemRN: [id number] of Birth: 1/27-May-1934

## 2017-08-23 ENCOUNTER — Encounter: Payer: Self-pay | Admitting: Physical Therapy

## 2017-08-24 ENCOUNTER — Ambulatory Visit
Admission: RE | Admit: 2017-08-24 | Discharge: 2017-08-24 | Disposition: A | Discharge status: Home / Self Care | Payer: Medicare Other | Source: Ambulatory Visit | Attending: Obstetrics and Gynecology | Admitting: Obstetrics and Gynecology

## 2017-08-24 ENCOUNTER — Other Ambulatory Visit
Admission: RE | Admit: 2017-08-24 | Discharge: 2017-08-24 | Disposition: A | Discharge status: Home / Self Care | Payer: Medicare Other | Source: Ambulatory Visit | Attending: Family Medicine | Admitting: Family Medicine

## 2017-08-24 ENCOUNTER — Ambulatory Visit: Payer: Medicare Other | Admitting: Physical Therapy

## 2017-08-24 DIAGNOSIS — I2699 Other pulmonary embolism without acute cor pulmonale: Secondary | ICD-10-CM | POA: Diagnosis present

## 2017-08-24 LAB — CREATININE, SERUM
CREATININE: 0.65 mg/dL (ref 0.44–1.00)
GFR calc Af Amer: >60 mL/min (ref >60)
GFR calc non Af Amer: >60 mL/min (ref >60)

## 2017-08-24 MED ORDER — IOPAMIDOL (ISOVUE-300) INJECTION 61%
50.0000 mL | Freq: Once | INTRAVENOUS | Status: AC | PRN
  Administered 2017-08-24: 100 mL via INTRAVENOUS

## 2019-03-03 IMAGING — CT CT ANGIO CHEST
2 of 6 series · 17 of 36 positions shown · IV contrast (APPLIED)
Comparison: 05/24/2017

CLINICAL DATA: Followup pulmonary emboli.

EXAM:
CT ANGIOGRAPHY CHEST WITH CONTRAST
TECHNIQUE: Multidetector CT imaging of the chest was performed using the
standard protocol during bolus administration of intravenous
contrast. Multiplanar CT image reconstructions and MIPs were
obtained to evaluate the vascular anatomy.
CONTRAST:  100mL 7XHMWJ-PRR IOPAMIDOL (7XHMWJ-PRR) INJECTION 61%

[Series 5: thins · axial · 0.60mm/px · z∈[-520,-308]mm · 16 of 200 slices shown]
[im 12/200  lung]
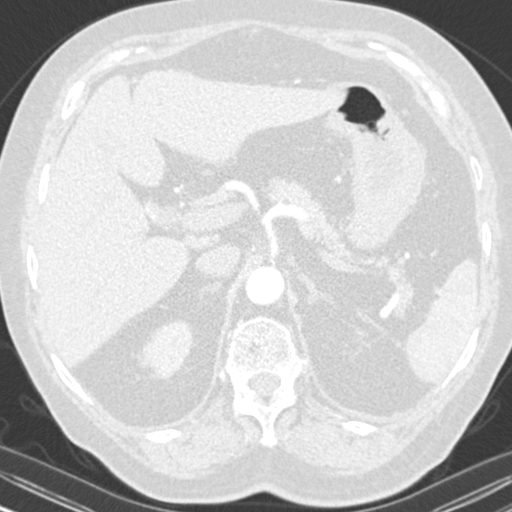
[im 24/200  mediastinal]
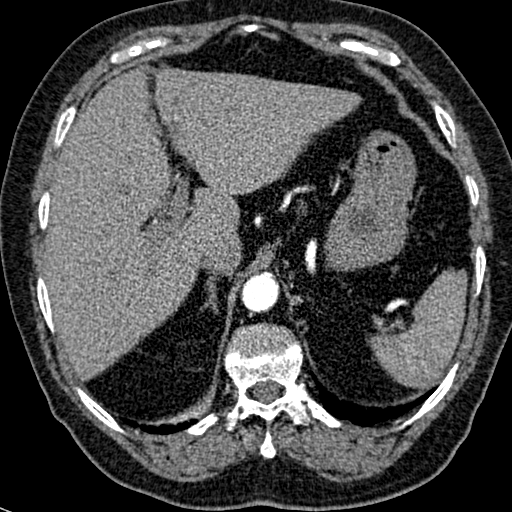
[im 36/200  lung]
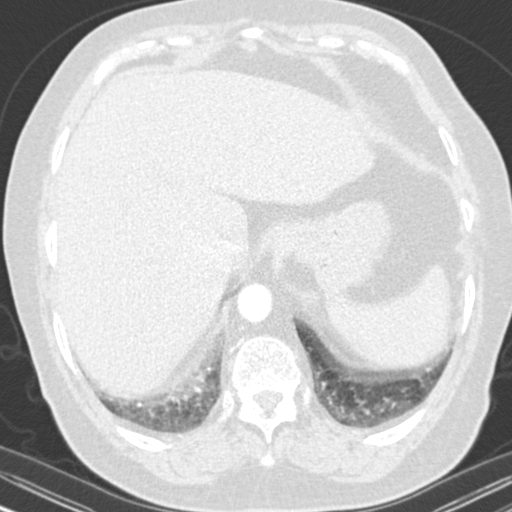
[im 47/200  mediastinal]
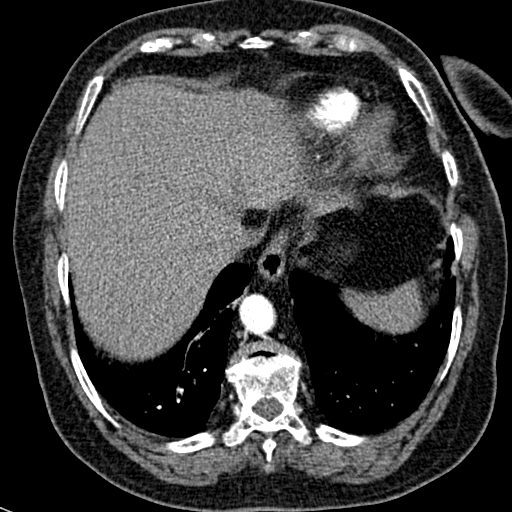
[im 59/200  lung]
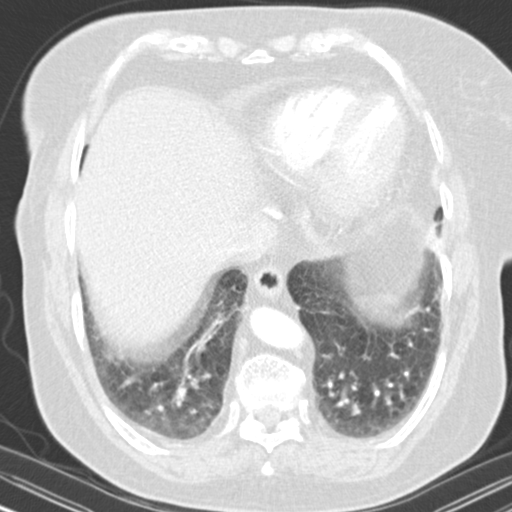
[im 71/200  mediastinal]
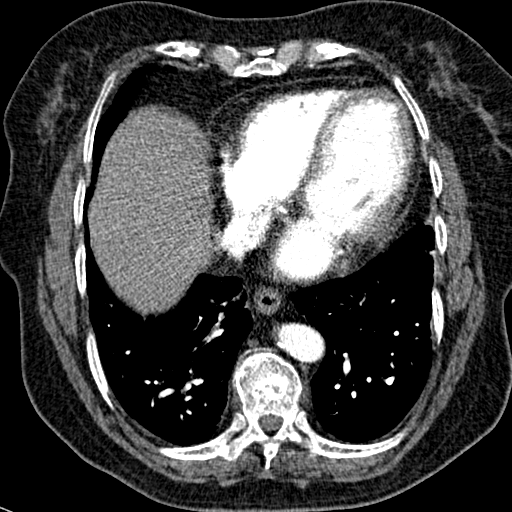
[im 82/200  lung]
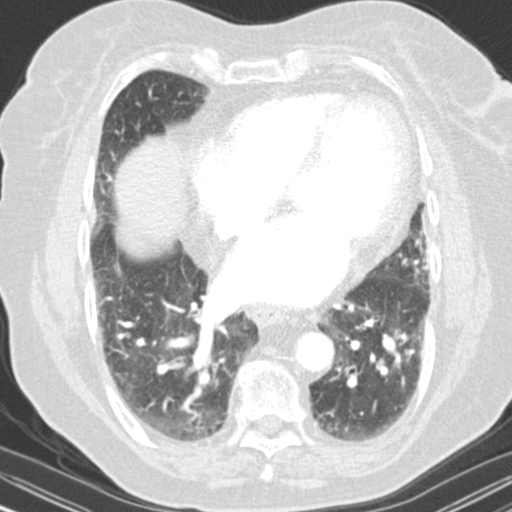
[im 94/200  mediastinal]
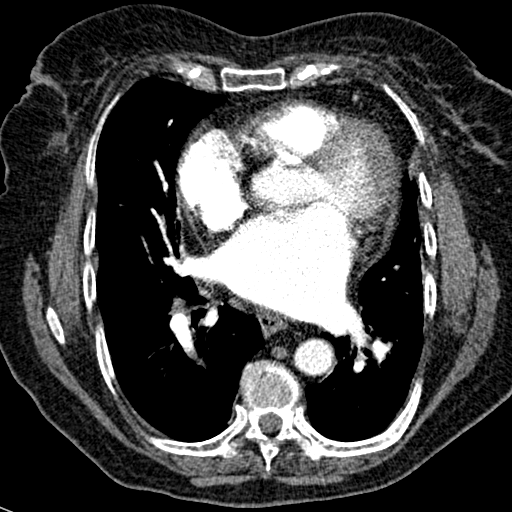
[im 106/200  lung]
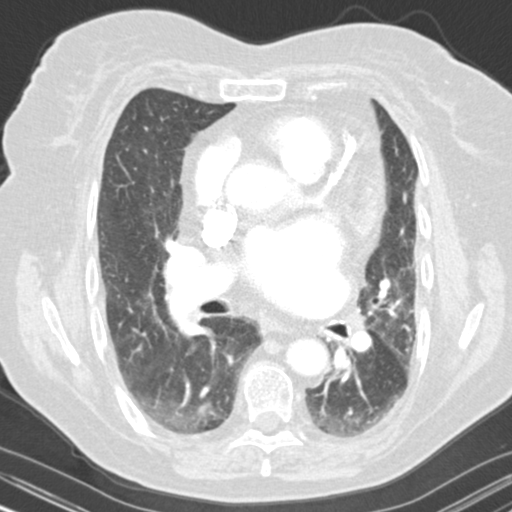
[im 118/200  mediastinal]
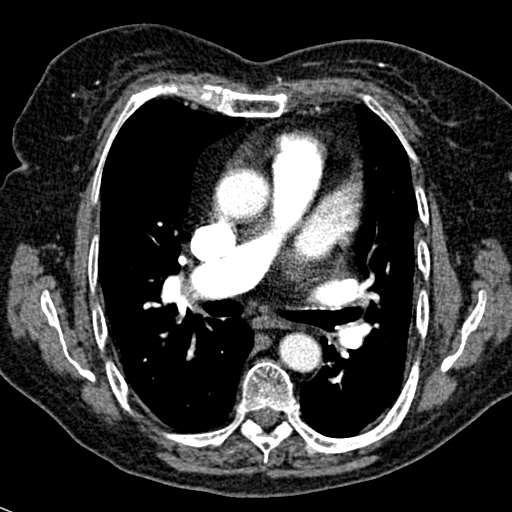
[im 129/200  lung]
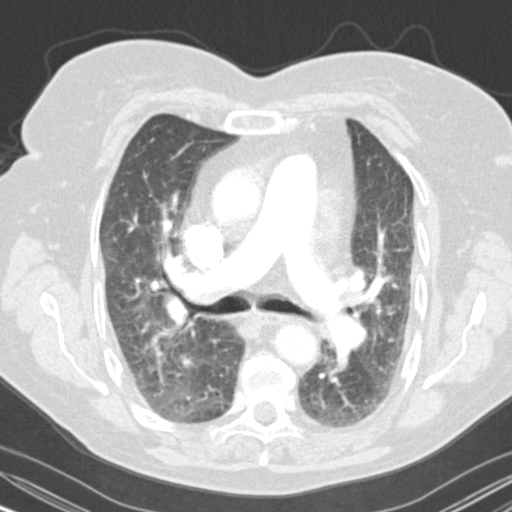
[im 141/200  mediastinal]
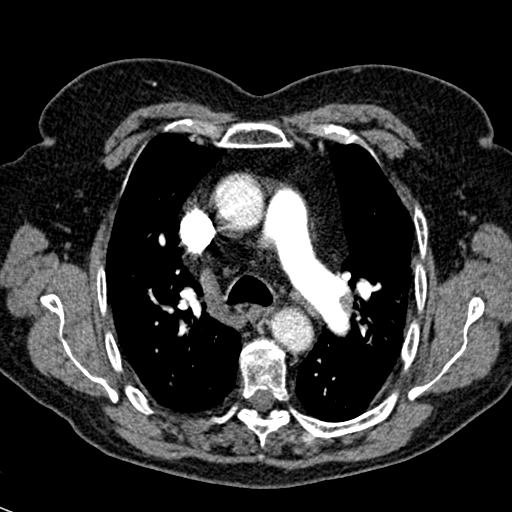
[im 153/200  lung]
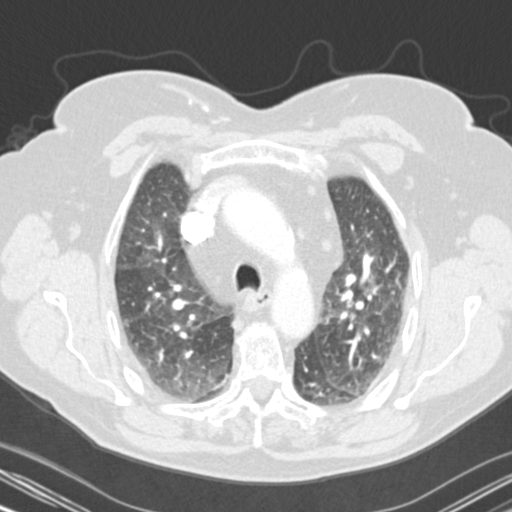
[im 164/200  mediastinal]
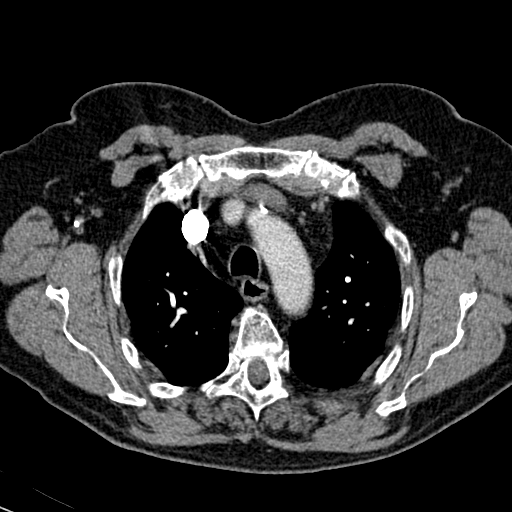
[im 176/200  lung]
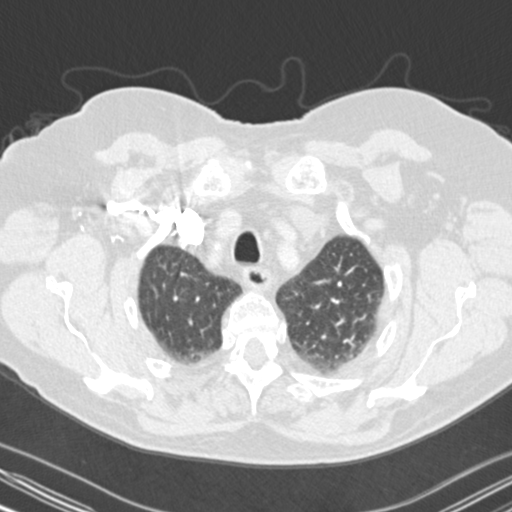
[im 188/200  mediastinal]
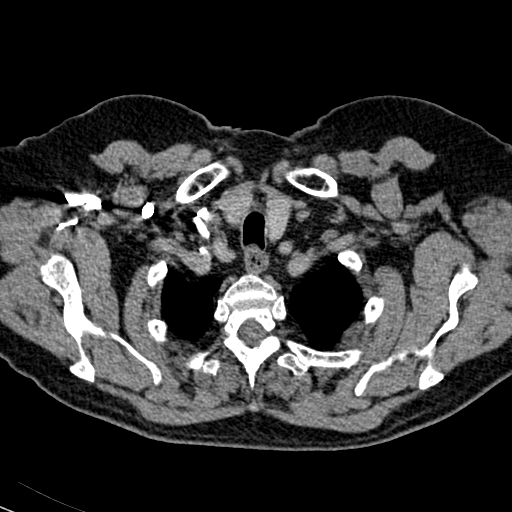

[Series 602: coronal · coronal · 0.60mm/px · 1 of 155 slices shown]
[im 78/155  mediastinal]
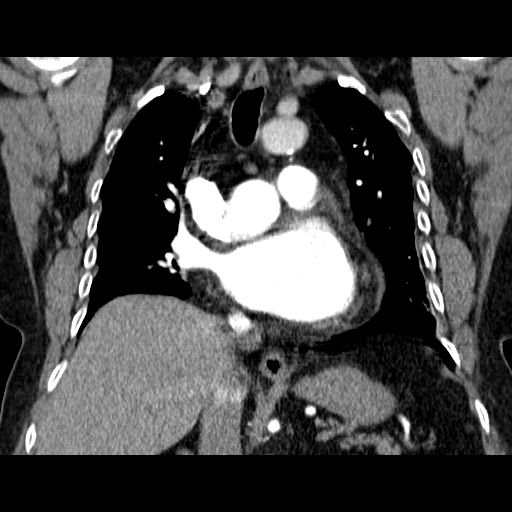

[17 of 36 positions shown; findings below may reference images not displayed]

FINDINGS: Cardiovascular: Recanalization of the right upper lobe pulmonary
artery with no evidence occlusion. Small amount residual thrombus
remains (image 60, series 5). No new pulmonary emboli are present.

No acute findings of the aorta or great vessels. Coronary stent
noted in the LAD.

Mediastinum/Nodes: No axillary supraclavicular adenopathy. No
mediastinal hilar adenopathy.

Lungs/Pleura: No pulmonary infarction. Mild atelectasis. Edema.
Pneumothorax

Upper Abdomen: Limited view of the liver, kidneys, pancreas are
unremarkable. Normal adrenal glands.

Musculoskeletal: Chronic compression deformity in the in the
midthoracic spine.

Review of the MIP images confirms the above findings.
IMPRESSION: 1. Recanalization of RIGHT upper lobe pulmonary arteries with
minimal residual thrombus.
2. No evidence of new thromboemboli in the pulmonary arteries.

## 2023-08-05 DEATH — deceased
